# Patient Record
Sex: Male | Born: 2000 | Race: Black or African American | Hispanic: No | Marital: Single | Smoking: Never smoker
Health system: Southern US, Community
[De-identification: ages and names within clinical notes are randomized; demographics above are authoritative.]

## PROBLEM LIST (undated history)

## (undated) DIAGNOSIS — I340 Nonrheumatic mitral (valve) insufficiency: Secondary | ICD-10-CM

## (undated) DIAGNOSIS — Q681 Congenital deformity of finger(s) and hand: Secondary | ICD-10-CM

## (undated) DIAGNOSIS — M419 Scoliosis, unspecified: Secondary | ICD-10-CM

---
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
## 2018-03-03 ENCOUNTER — Ambulatory Visit (INDEPENDENT_AMBULATORY_CARE_PROVIDER_SITE_OTHER): Payer: No Typology Code available for payment source | Admitting: Physical Medicine and Rehabilitation

## 2018-03-03 ENCOUNTER — Telehealth (INDEPENDENT_AMBULATORY_CARE_PROVIDER_SITE_OTHER): Payer: Self-pay | Admitting: Physical Medicine and Rehabilitation

## 2018-03-03 ENCOUNTER — Encounter (INDEPENDENT_AMBULATORY_CARE_PROVIDER_SITE_OTHER): Payer: Self-pay | Admitting: Physical Medicine and Rehabilitation

## 2018-03-03 ENCOUNTER — Ambulatory Visit (INDEPENDENT_AMBULATORY_CARE_PROVIDER_SITE_OTHER): Payer: Self-pay

## 2018-03-03 VITALS — BP 110/65 | HR 63 | Temp 98.4°F | Ht 71.0 in | Wt 127.2 lb

## 2018-03-03 DIAGNOSIS — M545 Low back pain: Secondary | ICD-10-CM | POA: Diagnosis not present

## 2018-03-03 DIAGNOSIS — M546 Pain in thoracic spine: Secondary | ICD-10-CM | POA: Diagnosis not present

## 2018-03-03 DIAGNOSIS — M41125 Adolescent idiopathic scoliosis, thoracolumbar region: Secondary | ICD-10-CM | POA: Diagnosis not present

## 2018-03-03 DIAGNOSIS — M7918 Myalgia, other site: Secondary | ICD-10-CM

## 2018-03-03 DIAGNOSIS — G8929 Other chronic pain: Secondary | ICD-10-CM

## 2018-03-03 NOTE — Progress Notes (Signed)
 .  Numeric Pain Rating Scale and Functional Assessment Average Pain 5 Pain Right Now 2 My pain is  Pain is worse with: bending, standing and some activites Pain improves with: rest and medication   In the last MONTH (on 0-10 scale) has pain interfered with the following?  1. General activity like being  able to carry out your everyday physical activities such as walking, climbing stairs, carrying groceries, or moving a chair?  Rating(4)  2. Relation with others like being able to carry out your usual social activities and roles such as  activities at home, at work and in your community. Rating(2)  3. Enjoyment of life such that you have  been bothered by emotional problems such as feeling anxious, depressed or irritable?  Rating(2)

## 2018-03-04 MED FILL — ERYTHROMYCIN-BENZOYL GEL: 5-3 | 30 days supply | Qty: 47 | Fill #4

## 2018-03-04 NOTE — Telephone Encounter (Signed)
Sent referral for [facility] [place]

## 2018-03-04 NOTE — Telephone Encounter (Signed)
Patient notified

## 2018-03-07 MED FILL — VYVANSE 20 MG CAPSULE: 20 | 30 days supply | Qty: 30 | Fill #0

## 2018-03-09 ENCOUNTER — Encounter (INDEPENDENT_AMBULATORY_CARE_PROVIDER_SITE_OTHER): Payer: Self-pay | Admitting: Physical Medicine and Rehabilitation

## 2018-03-09 NOTE — Progress Notes (Signed)
Eric Thomas - 17 y.o. male MRN [id number]  Date of birth: 09/24/2000  Office Visit Note: Visit Date: 03/03/2018 PCP: Farris HasMorrow, Aaron, MD Referred by: Farris HasMorrow, Aaron, MD  Subjective: Chief Complaint  Patient presents with  . Spine - Pain  . Neck - Pain  . Middle Back - Pain  . Lower Back - Pain   HPI: Eric Thomas is a pleasant 17 year old young gentleman present with his mother who also provides some of the history.  He has a history of idiopathic scoliosis that has been [facility] by Dr. Fanny Bienichard C. Henderson.  He was last seen there for routine follow-up on 10/30/2014.  He was started in a brace in 11/13 for documented progression up to 27 degrees.  This brace was discontinued in 2016 Lajoyce CornersDuda lack of progression of the curve.  According to the records there was some concern at one point of possible Marfan syndrome and patient underwent some genetic testing and this was determined not to be the case.  He comes in now with increasing axial neck pain really centered around the C7 spinous process and in the musculature really bilaterally.  He also has some lower back pain that he feels like is worse at times with lifting and twisting at work.  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  He is wondering about reduced activity which he is able to do and still function at his main job.  He has not had any severe back pain but just worsening back pain.  He rates his pain as a 4 out of 10 but it can get worse at times with particular movements and on particular days.  He has not had any radicular complaints down the arms or paresthesias.  No complaints down the legs or paresthesias or weakness.  His symptoms of just been progressive particularly at work.  In his spare time he does not really exercise at all he does like using the phone in the computer.  He does try to maintain a computer height above high-level and is not spending too much time  looking forward but he can do that with the phone at times.  He has not had recent physical therapy.  He manages pain relief with over-the-counter medication.       Review of Systems  Constitutional: Negative for chills, fever, malaise/fatigue and weight loss.  HENT: Negative for hearing loss and sinus pain.   Eyes: Negative for blurred vision, double vision and photophobia.  Respiratory: Negative for cough and shortness of breath.   Cardiovascular: Negative for chest pain, palpitations and leg swelling.  Gastrointestinal: Negative for abdominal pain, nausea and vomiting.  Genitourinary: Negative for flank pain.  Musculoskeletal: Positive for back pain and neck pain. Negative for myalgias.  Skin: Negative for itching and rash.  Neurological: Negative for tremors, focal weakness and weakness.  Endo/Heme/Allergies: Negative.   Psychiatric/Behavioral: Negative for depression.  All other systems reviewed and are negative.  Otherwise per HPI.  Assessment & Plan: Visit Diagnoses:  1. Adolescent idiopathic scoliosis of thoracolumbar region   2. Chronic midline thoracic back pain   3. Chronic midline low back pain without sciatica   4. Myofascial pain syndrome     Plan: Findings:  History of idiopathic scoliosis managed at [facility] with bracing up until 2016.  Curvature was noted to have progressed around 27 degrees.  I did find images that were taken  prior to the office visit at [facility] and this was done locally in town so I was able to see an old picture.  He had a thoraco-lumbar curve which was 27 and a lumbar curve which is 24 degrees Cobb angle.  We did take x-rays today and there is really no changes overall.  In a limited way we can do what I measure Cobb angle of approximately 34 degrees.  This is within the realm of measurement error on the images.  Just from an eyeball standpoint it did not appear the curve was significantly worse.  Otherwise he has some mild degenerative change on  the left at the lower lumbar region but other than that fairly normal spine other than the curvature.  He is getting some curvature obviously of the rib cage.  Interestingly the iliac crest heights are equal and there is no relief seen pelvic rotation.  At this point I think regrouping with a physical therapist to look at myofascial pain findings particularly in the cervical spine and trapezius and levator scapula as well as getting him on a program for core strengthening and stretching given his history of the scoliosis.  I encouraged him to be more active and at least get a good program from the physical therapist that he can do.  I think building himself up to a degree as can help him in the long run.  We also did write  a work note with recommendations of limited lifting bending and twisting particular from the lumbar spine region.  Obviously if there is some concerns at work we can address that further.  We will see him back at this point as needed.    Meds & Orders: No orders of the defined types were placed in this encounter.   Orders Placed This Encounter  Procedures  . XR SCOLIOSIS EVAL COMPLETE SPINE 1 VIEW  . Ambulatory referral to Physical Therapy    Follow-up: Return if symptoms worsen or fail to improve.   Procedures: No procedures performed  No notes on file   Clinical History: EXAM: XR SCOLIOSIS AP ONLY DATE: 10/30/14 09:54:12 ACCESSION: [id number] UN DICTATED: 10/30/14 10:34:21 INTERPRETATION LOCATION: [facility]  CLINICAL INDICATION: 17 Year Old (M): M41.20 - Other idiopathic scoliosis.  Main wr   Out of brace  COMPARISON: Radiograph of 01/30/14  TECHNIQUE: AP erect views of the spine  FINDINGS: Redemonstration of S. shaped scoliosis, similar to prior examination. 12 rib bearing vertebral bodies. 5 lumbar type vertebral bodies. The cardiomediastinal silhouette is normal. No focal lung opacity. Nonobstructed bowel gas pattern.  IMPRESSION: No significant change in  the appearance of S. shaped scoliosis.   He reports that he has never smoked. He has never used smokeless tobacco. No results for input(s): HGBA1C, LABURIC in the last 8760 hours.  Objective:  VS:  HT:5\' 11"  (180.3 cm)   WT:127 lb 3.2 oz (57.7 kg)  BMI:17.75    BP:110/65  HR:63bpm  TEMP:98.4 F (36.9 C)(Oral)  RESP:100 % Physical Exam  Constitutional: He is oriented to person, place, and time. He appears well-developed and well-nourished. No distress.  HENT:  Head: Normocephalic and atraumatic.  Nose: Nose normal.  Mouth/Throat: Oropharynx is clear and moist.  Eyes: Pupils are equal, round, and reactive to light. Conjunctivae are normal.  Neck: Normal range of motion. Neck supple. No tracheal deviation present.  Cardiovascular: Regular rhythm and intact distal pulses.  Pulmonary/Chest: Effort normal and breath sounds normal.  Abdominal: Soft. He exhibits no distension. There is no  rebound and no guarding.  Musculoskeletal: He exhibits no deformity.  Patient is somewhat slow to arise from a seated position.  He has obviou scoliotic deformity of the thoracic and lumbar spine.  Hip heights are equal across with measurements across the iliac crest.  He has no pain with hip rotation he has good distal strength without clonus.  He does have some paraspinal tenderness along the curvature with real tight paraspinal musculature.  Cervical spine is forward flexed with pain about the C7 spinous process in particular the trapezius and levator scapular muscles.  There are focal trigger points present.  Neurological: He is alert and oriented to person, place, and time. He exhibits normal muscle tone. Coordination normal.  Skin: Skin is warm. No rash noted.  Psychiatric: He has a normal mood and affect. His behavior is normal.  Nursing note and vitals reviewed.   Ortho Exam Imaging: No results found.  Past Medical/Family/Surgical/Social History: Medications & Allergies reviewed per EMR, new  medications updated. Patient Active Problem List   Diagnosis Date Noted  . Chest pain, musculoskeletal 12/03/2016  . Arachnodactyly 03/07/2013  . Mitral insufficiency 03/07/2013  . Palpitations 03/07/2013  . Scoliosis (and kyphoscoliosis), idiopathic 06/15/2012   Past Medical History:  Diagnosis Date  . Arachnodactyly   . Mitral insufficiency   . Scoliosis    History reviewed. No pertinent family history. History reviewed. No pertinent surgical history. Social History   Occupational History  . Not on file  Tobacco Use  . Smoking status: Never Smoker  . Smokeless tobacco: Never Used  Substance and Sexual Activity  . Alcohol use: Never    Frequency: Never  . Drug use: Never  . Sexual activity: Not on file

## 2018-03-11 ENCOUNTER — Encounter: Payer: Self-pay | Admitting: Physical Therapy

## 2018-03-11 ENCOUNTER — Other Ambulatory Visit: Payer: Self-pay

## 2018-03-11 ENCOUNTER — Ambulatory Visit
Payer: No Typology Code available for payment source | Attending: Physical Medicine and Rehabilitation | Admitting: Physical Therapy

## 2018-03-11 DIAGNOSIS — M6281 Muscle weakness (generalized): Secondary | ICD-10-CM | POA: Diagnosis present

## 2018-03-11 DIAGNOSIS — M546 Pain in thoracic spine: Secondary | ICD-10-CM | POA: Diagnosis present

## 2018-03-11 DIAGNOSIS — R293 Abnormal posture: Secondary | ICD-10-CM | POA: Insufficient documentation

## 2018-03-11 DIAGNOSIS — G8929 Other chronic pain: Secondary | ICD-10-CM | POA: Insufficient documentation

## 2018-03-11 DIAGNOSIS — M545 Low back pain: Secondary | ICD-10-CM | POA: Insufficient documentation

## 2018-03-11 NOTE — Therapy (Signed)
[facility] [address], [address] [place], [place], [address] Phone: [phone]   Fax:  [phone]  Physical Therapy Evaluation  Patient Details  Name: Eric Thomas MRN: [id number] Date of Birth: 03/31/2001 Referring Provider: Tyrell AntonioFrederic Newton, MD    Encounter Date: 03/11/2018  PT End of Session - 03/11/18 1145    Visit Number  1    Date for PT Re-Evaluation  05/13/18    Authorization Type  [facility]    Authorization Time Period  03/11/18 to 05/13/18    PT Start Time  1059    PT Stop Time  1137    PT Time Calculation (min)  38 min    Activity Tolerance  No increased pain;Patient tolerated treatment well    Behavior During Therapy  [facility] for tasks assessed/performed       Past Medical History:  Diagnosis Date  . Arachnodactyly   . Mitral insufficiency   . Scoliosis     History reviewed. No pertinent surgical history.  There were no vitals filed for this visit.   Subjective Assessment - 03/11/18 1102    Subjective  Pt reports having scoliosis diagnosis since middle school and he has been following [facility] at [employer]. He wore a back brace between 6th-9th grade but this had been discontinued. He has noticed this past summer his back started to bothering him more while at work and during class.     Pertinent History  thoracolumbar scoliosis     Limitations  Lifting;Sitting    Patient Stated Goals  decrease pain with work and school     Currently in Pain?  No/denies   typically Lt upper back and bilateral low back         [facility] PT Assessment - 03/11/18 0001      Assessment   Medical Diagnosis  adolescent idiopathic scoliosis     Referring Provider  Tyrell AntonioFrederic Newton, MD     Next MD Visit  none for now     Prior Therapy  none       Precautions   Precautions  None      Balance Screen   Has the patient fallen in the past 6 months  No    Has the patient had a decrease in activity level because of a fear of falling?    No    Is the patient reluctant to leave their home because of a fear of falling?   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       Cognition   Overall Cognitive Status  Within Functional Limits for tasks assessed      Observation/Other Assessments   Focus on Therapeutic Outcomes (FOTO)   48% limited       Sensation   Additional Comments  denies numbness and tingling       ROM / Strength   AROM / PROM / Strength  AROM;Strength      AROM   Overall AROM Comments  AROM: limited flexion and extension limited 50%, pain free all directions       Strength   Overall Strength Comments  plank elbows/toes: unable without trunk sag and excessive scap retraction    Strength Assessment Site  Shoulder    Right/Left Shoulder  Right;Left    Right Shoulder Flexion  4/5    Right Shoulder  ABduction  4/5    Left Shoulder Flexion  4/5    Left Shoulder ABduction  4/5      Flexibility   Soft Tissue Assessment /Muscle Length  yes    Hamstrings  B 90/90 testing 50 deg    Quadriceps  WNL      Palpation   Palpation comment  tender with palpation along Lt thoracic and Rt lumbar paraspinals, Lt rhomboids, Rt QL                Objective measurements completed on examination: See above findings.      [facility] Adult PT Treatment/Exercise - 03/11/18 0001      Exercises   Exercises  Lumbar      Lumbar Exercises: Stretches   Active Hamstring Stretch  5 reps;10 seconds;Left;Right    Active Hamstring Stretch Limitations  90/90      Lumbar Exercises: Seated   Other Seated Lumbar Exercises  BUE horizontal abduction with red TB x5 reps, HEP demo     Other Seated Lumbar Exercises  scap retraction x10 reps       Lumbar Exercises: Supine   Other Supine Lumbar Exercises  low trunk rotation x10 reps, hooklying              PT Education - 03/11/18 1329    Education Details  eval  findings/POC; sitting posture; implemented HEP and reviewed     Person(s) Educated  Patient    Methods  Explanation;Verbal cues;Handout    Comprehension  Verbalized understanding;Returned demonstration       PT Short Term Goals - 03/11/18 1339      PT SHORT TERM GOAL #1   Title  Pt will demo consistency and independence with his initial HEP to improve posture and decrease pain.    Time  4    Period  Weeks    Status  New    Target Date  04/11/18      PT SHORT TERM GOAL #2   Title  Pt will be able to verbalize pain management techniques throughout the day to decrease pain following a busy school/work day.    Time  4    Period  Weeks    Status  New        PT Long Term Goals - 03/11/18 1341      PT LONG TERM GOAL #1   Title  Pt will be able to maintain plank on elbows/feet for atleast 15 sec at a time, 2/3 trials, without compensation, to reflect improvements in trunk strength and endurance.     Time  8    Period  Weeks    Status  New    Target Date  05/13/18      PT LONG TERM GOAL #2   Title  Pt will demo improved hamstring flexibility to lacking no more than 30 deg with 90/90 testing to improve his sitting alignment during class.     Time  8    Period  Weeks    Status  New      PT LONG TERM GOAL #3   Title  Pt will report atleast 60% improvement in his back pain from the start of PT to allow him to work a shift without limitation.     Time  8    Period  Weeks    Status  New      PT LONG TERM GOAL #4   Title  Pt will demo proper body mechanics with lifting  10# box from the floor x5 reps without the need for cuing from the therapist.     Time  8    Period  Weeks    Status  New             Plan - 03/11/18 1146    Clinical Impression Statement  Pt is a pleasant 17 y.o M referred to OPPT with diagnosis of adolescent idiopathic scoliosis with recent onset of upper and lower midline back pain with work tasks and during class. He demonstrates active lumbar and  thoracic active ROM that is within normal limits, however he does have palpable tenderness along the Rt lumbar paraspinals and Lt rhomboids and thoracic paraspinals. Pt demonstrates poor core strength and is unable to complete a plank without compensations. He currently has to lift boxes at work and has had difficulty with back pain during the school semester. He would benefit from skilled PT to address his limitations in strength, improve his posture and body mechanics with daily activity, to facilitate his return to pain free participation in school activity and tasks at his job.     History and Personal Factors relevant to plan of care:  thoracolumbar scoliosis     Clinical Presentation  Stable    Clinical Presentation due to:  gradually worse with summer job     Clinical Decision Making  Low    Rehab Potential  Good    PT Frequency  2x / week    PT Duration  8 weeks    PT Treatment/Interventions  ADLs/Self Care Home Management;Cryotherapy;Electrical Stimulation;Moist Heat;Therapeutic activities;Therapeutic exercise;Neuromuscular re-education;Patient/family education;Manual techniques;Taping;Dry needling;Passive range of motion    PT Next Visit Plan  trunk strength and endurance; hamstring flexibility; scap strength    PT Home Exercise Plan  [email or web address]    Consulted and Agree with Plan of Care  Patient       Patient will benefit from skilled therapeutic intervention in order to improve the following deficits and impairments:  Decreased activity tolerance, Decreased safety awareness, Decreased strength, Impaired flexibility, Postural dysfunction, Pain, Improper body mechanics, Decreased range of motion, Increased muscle spasms  Visit Diagnosis: Chronic low back pain, unspecified back pain laterality, with sciatica presence unspecified  Pain in thoracic spine  Muscle weakness (generalized)  Abnormal posture     Problem List Patient Active Problem List   Diagnosis Date Noted  . Chest  pain, musculoskeletal 12/03/2016  . Arachnodactyly 03/07/2013  . Mitral insufficiency 03/07/2013  . Palpitations 03/07/2013  . Scoliosis (and kyphoscoliosis), idiopathic 06/15/2012   2:02 PM,03/11/18 Donita Brooks PT, DPT [facility] [place]  [phone]  [facility] [address] [place], [place], [address] Phone: [phone]   Fax:  [phone]  Name: Eric Thomas MRN: [id number] Date of Birth: [birth year]

## 2018-03-11 NOTE — Patient Instructions (Signed)
Access Code: [email or web address]  URL: [email or web address]  Date: 03/11/2018  Prepared by: Marylyn IshiharaSara Kiser   Exercises  Correct Seated Posture - 10 reps - 3 sets - 1x daily - 7x weekly  Seated Scapular Retraction - 10 reps - 2 sets - 2x daily - 7x weekly  Seated Shoulder Horizontal Abduction with Resistance - 10 reps - 2 sets - 2x daily - 7x weekly  Supine Hamstring Stretch - 10 reps - 10 hold - 2x daily - 7x weekly  Supine Lower Trunk Rotation - 10 reps - 1 sets - 2x daily - 7x weekly    [facility] [address], [address] [place], [place] [address] Phone # [phone] Fax [phone]

## 2018-03-17 MED FILL — ADAPALENE 0.1% CREAM: 0.1 | 30 days supply | Qty: 45 | Fill #2

## 2018-03-23 ENCOUNTER — Encounter: Payer: Self-pay | Admitting: Physical Therapy

## 2018-03-23 ENCOUNTER — Ambulatory Visit: Payer: No Typology Code available for payment source | Admitting: Physical Therapy

## 2018-03-23 DIAGNOSIS — R293 Abnormal posture: Secondary | ICD-10-CM

## 2018-03-23 DIAGNOSIS — M545 Low back pain: Principal | ICD-10-CM

## 2018-03-23 DIAGNOSIS — M546 Pain in thoracic spine: Secondary | ICD-10-CM

## 2018-03-23 DIAGNOSIS — G8929 Other chronic pain: Secondary | ICD-10-CM

## 2018-03-23 DIAGNOSIS — M6281 Muscle weakness (generalized): Secondary | ICD-10-CM

## 2018-03-23 NOTE — Therapy (Signed)
[facility] [address], [address] [place], [place], [address] Phone: [phone]   Fax:  [phone]  Physical Therapy Treatment  Patient Details  Name: Eric Thomas MRN: [id number] Date of Birth: 10/04/2000 Referring Provider: Tyrell AntonioFrederic Newton, MD    Encounter Date: 03/23/2018  PT End of Session - 03/23/18 1224    Visit Number  2    Date for PT Re-Evaluation  05/13/18    Authorization Type  [facility]    Authorization Time Period  03/11/18 to 05/13/18    PT Start Time  1145    PT Stop Time  1224    PT Time Calculation (min)  39 min    Activity Tolerance  No increased pain;Patient tolerated treatment well    Behavior During Therapy  [facility] for tasks assessed/performed       Past Medical History:  Diagnosis Date  . Arachnodactyly   . Mitral insufficiency   . Scoliosis     History reviewed. No pertinent surgical history.  There were no vitals filed for this visit.  Subjective Assessment - 03/23/18 1146    Subjective  Pt states things are going well. No difficulty with his exercises.     Pertinent History  thoracolumbar scoliosis     Limitations  Lifting;Sitting    Patient Stated Goals  decrease pain with work and school     Currently in Pain?  Yes    Pain Score  2     Pain Location  Shoulder    Pain Orientation  Right;Medial;Upper    Pain Descriptors / Indicators  Aching    Pain Type  Acute pain    Pain Radiating Towards  none     Pain Onset  More than a month ago    Pain Frequency  Intermittent    Aggravating Factors   alot of activity     Pain Relieving Factors  unsure                        [facility] Adult PT Treatment/Exercise - 03/23/18 0001      Exercises   Exercises  Shoulder;Other Exercises;Lumbar    Other Exercises   B knees to chest stretch x10 reps 5 sec hold       Lumbar Exercises: Standing   Other Standing Lumbar Exercises  plank hold on counter, elbows extended 3x20 sec        Lumbar Exercises: Supine   Other Supine Lumbar Exercises  bent knee 90/90 with alternating heel tap x10 reps      Shoulder Exercises: Seated   Row  Both;10 reps    Row Limitations  x1 set with green, x1 set with red     Protraction  10 reps;Both    Theraband Level (Shoulder Protraction)  Level 3 (Green)    Protraction Limitations  x2 sets     Diagonals  Both;15 reps    Theraband Level (Shoulder Diagonals)  Level 2 (Red)    Diagonals Limitations  D2 flexion    Other Seated Exercises  horizontal abduction with blue TB x10 reps       Shoulder Exercises: ROM/Strengthening   UBE (Upper Arm Bike)  L1 x2 min forward, x2 min backward sitting on green physioball to encourage trunk and shoulder endurance.     Pushups  15 reps    Pushups Limitations  x2 sets on wall, 2nd set 10 reps      Plank Limitations  plank with elbows  extended x15 reps with scap retraction       standing tandem pallof press with red TB x10 reps each direction with each LE forward.       PT Education - 03/23/18 1224    Education Details  importance of continuing with therapy; updated HEP    Person(s) Educated  Patient    Methods  Handout;Verbal cues;Explanation    Comprehension  Verbalized understanding;Returned demonstration       PT Short Term Goals - 03/11/18 1339      PT SHORT TERM GOAL #1   Title  Pt will demo consistency and independence with his initial HEP to improve posture and decrease pain.    Time  4    Period  Weeks    Status  New    Target Date  04/11/18      PT SHORT TERM GOAL #2   Title  Pt will be able to verbalize pain management techniques throughout the day to decrease pain following a busy school/work day.    Time  4    Period  Weeks    Status  New        PT Long Term Goals - 03/11/18 1341      PT LONG TERM GOAL #1   Title  Pt will be able to maintain plank on elbows/feet for atleast 15 sec at a time, 2/3 trials, without compensation, to reflect improvements in trunk strength  and endurance.     Time  8    Period  Weeks    Status  New    Target Date  05/13/18      PT LONG TERM GOAL #2   Title  Pt will demo improved hamstring flexibility to lacking no more than 30 deg with 90/90 testing to improve his sitting alignment during class.     Time  8    Period  Weeks    Status  New      PT LONG TERM GOAL #3   Title  Pt will report atleast 60% improvement in his back pain from the start of PT to allow him to work a shift without limitation.     Time  8    Period  Weeks    Status  New      PT LONG TERM GOAL #4   Title  Pt will demo proper body mechanics with lifting 10# box from the floor x5 reps without the need for cuing from the therapist.     Time  8    Period  Weeks    Status  New            Plan - 03/23/18 1152    Clinical Impression Statement  Pt arrives reporting some improvements in strength with his HEP. Therapist was able to make updates with scapula and trunk strengthening and pt required minimal cuing for proper technique. Pt had increased difficulty with trunk stability exercises in supine, as well as with push up against the wall. He required encouragement from the therapist to complete this. At this time, pt will be starting college soon and is unsure what his schedule will allow regarding future treatments. Therapist encouraged to continue with PT or transfer to a [facility] location in the [place] area. He verbalized understanding of this.     Rehab Potential  Good    PT Frequency  2x / week    PT Duration  8 weeks    PT Treatment/Interventions  ADLs/Self Care Home Management;Cryotherapy;Lobbyistlectrical  Stimulation;Moist Heat;Therapeutic activities;Therapeutic exercise;Neuromuscular re-education;Patient/family education;Manual techniques;Taping;Dry needling;Passive range of motion    PT Next Visit Plan  trunk strength and endurance; hamstring flexibility; scap strength    PT Home Exercise Plan  [email or web address]    Consulted and Agree with Plan of Care  Patient        Patient will benefit from skilled therapeutic intervention in order to improve the following deficits and impairments:  Decreased activity tolerance, Decreased safety awareness, Decreased strength, Impaired flexibility, Postural dysfunction, Pain, Improper body mechanics, Decreased range of motion, Increased muscle spasms  Visit Diagnosis: Chronic low back pain, unspecified back pain laterality, with sciatica presence unspecified  Pain in thoracic spine  Muscle weakness (generalized)  Abnormal posture     Problem List Patient Active Problem List   Diagnosis Date Noted  . Chest pain, musculoskeletal 12/03/2016  . Arachnodactyly 03/07/2013  . Mitral insufficiency 03/07/2013  . Palpitations 03/07/2013  . Scoliosis (and kyphoscoliosis), idiopathic 06/15/2012   12:27 PM,03/23/18 Donita Brooks PT, DPT [facility] [place]  [phone]  [facility] [address] [place], [place], [address] Phone: [phone]   Fax:  [phone]  Name: Eric Thomas MRN: [id number] Date of Birth: 08-25-2000

## 2018-03-23 NOTE — Patient Instructions (Signed)
Access Code: [email or web address]  URL: [email or web address]  Date: 03/23/2018  Prepared by: Marylyn IshiharaSara Kiser   Exercises  Correct Seated Posture - 10 reps - 3 sets - 1x daily - 7x weekly  Seated Shoulder Horizontal Abduction with Resistance - 10 reps - 2 sets - 2x daily - 7x weekly  Supine Hamstring Stretch - 10 reps - 10 hold - 2x daily - 7x weekly  Standing Serratus Punch with Resistance - 10 reps - 2 sets - 2x daily - 7x weekly  Supine Lower Trunk Rotation - 10 reps - 1 sets - 2x daily - 7x weekly  Standing Row with Anchored Resistance - 10 reps - 2 sets - 2x daily - 7x weekly  Standing Shoulder Horizontal and Diagonal Pulls with Resistance - 15 reps - 1 sets - 2x daily - 7x weekly  Plank on Counter - 2 reps - 30 hold - 2x daily - 7x weekly  Push Up on Table - 10 reps - 3 sets - 2x daily - 7x weekly   [facility] [address], [address] [place], [place] [address] Phone # [phone] Fax [phone]

## 2018-03-24 MED FILL — ADDERALL XR 20 MG CAP SA: 20 | 30 days supply | Qty: 30 | Fill #0

## 2018-03-28 ENCOUNTER — Encounter: Payer: No Typology Code available for payment source | Admitting: Physical Therapy

## 2018-03-29 ENCOUNTER — Ambulatory Visit: Payer: No Typology Code available for payment source | Attending: Physical Medicine and Rehabilitation

## 2018-03-29 DIAGNOSIS — G8929 Other chronic pain: Secondary | ICD-10-CM | POA: Insufficient documentation

## 2018-03-29 DIAGNOSIS — M545 Low back pain: Secondary | ICD-10-CM | POA: Insufficient documentation

## 2018-03-29 DIAGNOSIS — M546 Pain in thoracic spine: Secondary | ICD-10-CM | POA: Insufficient documentation

## 2018-03-29 DIAGNOSIS — R293 Abnormal posture: Secondary | ICD-10-CM | POA: Insufficient documentation

## 2018-03-29 DIAGNOSIS — M6281 Muscle weakness (generalized): Secondary | ICD-10-CM | POA: Insufficient documentation

## 2018-03-30 ENCOUNTER — Encounter: Payer: No Typology Code available for payment source | Admitting: Physical Therapy

## 2018-03-31 ENCOUNTER — Ambulatory Visit: Payer: No Typology Code available for payment source

## 2018-03-31 DIAGNOSIS — G8929 Other chronic pain: Secondary | ICD-10-CM | POA: Diagnosis present

## 2018-03-31 DIAGNOSIS — M546 Pain in thoracic spine: Secondary | ICD-10-CM | POA: Diagnosis present

## 2018-03-31 DIAGNOSIS — M6281 Muscle weakness (generalized): Secondary | ICD-10-CM | POA: Diagnosis present

## 2018-03-31 DIAGNOSIS — M545 Low back pain: Secondary | ICD-10-CM | POA: Diagnosis present

## 2018-03-31 DIAGNOSIS — R293 Abnormal posture: Secondary | ICD-10-CM | POA: Diagnosis present

## 2018-03-31 NOTE — Therapy (Signed)
[facility] [address], [place], [address] Phone: [phone]   Fax:  [phone]  Physical Therapy Treatment  Patient Details  Name: Eric Thomas MRN: [id number] Date of Birth: [birth year] Referring Provider: Tyrell Antonio, MD    Encounter Date: 03/31/2018  PT End of Session - 03/31/18 1754    Visit Number  3    Date for PT Re-Evaluation  05/13/18    Authorization Time Period  03/11/18 to 05/13/18    PT Start Time  1700    PT Stop Time  1740    PT Time Calculation (min)  40 min    Activity Tolerance  No increased pain;Patient tolerated treatment well    Behavior During Therapy  [facility] for tasks assessed/performed       Past Medical History:  Diagnosis Date  . Arachnodactyly   . Mitral insufficiency   . Scoliosis     History reviewed. No pertinent surgical history.  There were no vitals filed for this visit.  Subjective Assessment - 03/31/18 1656    Subjective  Patient reports that once his pain starts, he has it all day. States it happens pretty often. States he is keeping up pretty well with his exercises.    Pertinent History  thoracolumbar scoliosis     Patient Stated Goals  decrease pain with work and school     Currently in Pain?  Yes    Pain Score  2     Pain Location  Shoulder    Pain Orientation  Right;Medial;Upper    Pain Descriptors / Indicators  Tightness    Pain Onset  More than a month ago         Therapeutic exercise: Patient performed with instruction, verbal cues, tactile cues of therapist: goal: improve strength and posture  Lumbar extensions 2x10 with PT assist HS popliteal stretchs 3x30" with PT assist Serratus punches in supine and 5#lb dumbbell 2x10 b/l tactile cues Wall pushup plus 2x10 tactile cues Doorway rhomboid stretch 2 reps x30sec Standard plank x15sec x2 verbal/tactile cues Side plank on knees b/l x15sec x2 verbal cues Bird dog position, 1 leg extension x5  b/l, verbal/tactile cues for posture/core activation Single leg leg lift x10 b/l cues for pacing, technique   Patient response to treatment: patient demonstrated improved technique with exercises with repeated demonstration and VC. Patient reported intermittent pain with some exercises, modified accordingly to avoid symptoms. No complaints at end of session.        PT Education - 03/31/18 1744    Education Details  updated HEP, importance of compliance    Person(s) Educated  Patient    Methods  Demonstration;Verbal cues;Explanation;Handout    Comprehension  Verbalized understanding;Returned demonstration       PT Short Term Goals - 03/11/18 1339      PT SHORT TERM GOAL #1   Title  Pt will demo consistency and independence with his initial HEP to improve posture and decrease pain.    Time  4    Period  Weeks    Status  New    Target Date  04/11/18      PT SHORT TERM GOAL #2   Title  Pt will be able to verbalize pain management techniques throughout the day to decrease pain following a busy school/work day.    Time  4    Period  Weeks    Status  New        PT Long Term  Goals - 03/11/18 1341      PT LONG TERM GOAL #1   Title  Pt will be able to maintain plank on elbows/feet for atleast 15 sec at a time, 2/3 trials, without compensation, to reflect improvements in trunk strength and endurance.     Time  8    Period  Weeks    Status  New    Target Date  05/13/18      PT LONG TERM GOAL #2   Title  Pt will demo improved hamstring flexibility to lacking no more than 30 deg with 90/90 testing to improve his sitting alignment during class.     Time  8    Period  Weeks    Status  New      PT LONG TERM GOAL #3   Title  Pt will report atleast 60% improvement in his back pain from the start of PT to allow him to work a shift without limitation.     Time  8    Period  Weeks    Status  New      PT LONG TERM GOAL #4   Title  Pt will demo proper body mechanics with lifting  10# box from the floor x5 reps without the need for cuing from the therapist.     Time  8    Period  Weeks    Status  New            Plan - 03/31/18 1749    Clinical Impression Statement  Patient demonstrated good carryover with performance of exercises with cues from PT. Exercises modified for any pain symptoms to avoid pain.  Instructed in importance of compliance with HEP and to call/ask for assistance as needed. Patient and family in agreement with 1 month follow up to ensure HEP compliance and progress, if needed POC will be reassessed at that time.      Rehab Potential  Good    PT Frequency  Monthy    PT Duration  4 weeks    PT Treatment/Interventions  ADLs/Self Care Home Management;Cryotherapy;Electrical Stimulation;Moist Heat;Therapeutic activities;Therapeutic exercise;Neuromuscular re-education;Patient/family education;Manual techniques;Taping;Dry needling;Passive range of motion    PT Next Visit Plan  trunk strength and endurance; hamstring flexibility; scap strength    PT Home Exercise Plan  [email or web address]    Consulted and Agree with Plan of Care  Patient       Patient will benefit from skilled therapeutic intervention in order to improve the following deficits and impairments:  Decreased activity tolerance, Decreased safety awareness, Decreased strength, Impaired flexibility, Postural dysfunction, Pain, Improper body mechanics, Decreased range of motion, Increased muscle spasms  Visit Diagnosis: Chronic low back pain, unspecified back pain laterality, with sciatica presence unspecified  Pain in thoracic spine  Abnormal posture  Muscle weakness (generalized)     Problem List Patient Active Problem List   Diagnosis Date Noted  . Chest pain, musculoskeletal 12/03/2016  . Arachnodactyly 03/07/2013  . Mitral insufficiency 03/07/2013  . Palpitations 03/07/2013  . Scoliosis (and kyphoscoliosis), idiopathic 06/15/2012    Olga Coasteriana Gery Sabedra PT, DPT 5:56  PM,03/31/18 [phone]  [facility] [address] [place], [place], [address] Phone: [phone]   Fax:  [phone]  Name: Eric Thomas MRN: [id number] Date of Birth: 01/11/2001

## 2018-03-31 NOTE — Patient Instructions (Signed)
Access Code: [email or web address]  URL: [email or web address]  Date: 03/31/2018  Prepared by: Olga Coasteriana Terree Gaultney   Exercises  Supine Hamstring Stretch - 10 reps - 10 hold - 2x daily - 7x weekly  Doorway Rhomboid Stretch - 3 reps - 1 sets - 30 hold - 2x daily - 7x weekly  Correct Seated Posture - 10 reps - 3 sets - 1x daily - 7x weekly  Supine Lower Trunk Rotation - 10 reps - 1 sets - 2x daily - 7x weekly  Seated Shoulder Horizontal Abduction with Resistance - 10 reps - 2 sets - 2x daily - 4x weekly  Standing Serratus Punch with Resistance - 10 reps - 2 sets - 2x daily - 4x weekly  Standing Row with Anchored Resistance - 10 reps - 2 sets - 2x daily - 4x weekly  Standing Shoulder Horizontal and Diagonal Pulls with Resistance - 15 reps - 1 sets - 2x daily - 4x weekly  Push Up on Table - 10 reps - 2 sets - 2x daily - 4x weekly  Standard Plank - 3 reps - 1 sets - 15 hold - 1x daily - 4x weekly  Side Plank on Knees - 3 reps - 1 sets - 15 hold - 1x daily - 4x weekly  Bird Dog - 3 reps - 1 sets - 1x daily - 4x weekly  Supine Single Leg Lift - 3 reps - 1 sets - 1x daily - 4x weekly  Prone Chest Lift with Arms Overhead - 10 reps - 1 sets - 1x daily - 4x weekly

## 2018-04-25 ENCOUNTER — Ambulatory Visit: Payer: No Typology Code available for payment source

## 2018-05-04 ENCOUNTER — Ambulatory Visit: Payer: No Typology Code available for payment source | Attending: Physical Medicine and Rehabilitation

## 2018-05-04 DIAGNOSIS — M6281 Muscle weakness (generalized): Secondary | ICD-10-CM | POA: Insufficient documentation

## 2018-05-04 DIAGNOSIS — M546 Pain in thoracic spine: Secondary | ICD-10-CM | POA: Insufficient documentation

## 2018-05-04 DIAGNOSIS — R293 Abnormal posture: Secondary | ICD-10-CM | POA: Diagnosis present

## 2018-05-04 NOTE — Therapy (Addendum)
[facility] [address] [place], [address] Phone: [phone]   Fax:  [phone]  Physical Therapy Treatment/Recertification/Progress Note  Dates of reporting period   03/31/18   to   05/04/18  Patient Details  Name: Eric Thomas MRN: [id number] Date of Birth: [birth year] Referring Provider (PT): Magnus Sinning, MD    Encounter Date: 05/04/2018  PT End of Session - 05/04/18 1533    Visit Number  4    Date for PT Re-Evaluation  06/29/18    Authorization Time Period  05/04/18 to 06/29/18    PT Start Time  0488    PT Stop Time  1615    PT Time Calculation (min)  40 min    Activity Tolerance  Patient tolerated treatment well;No increased pain    Behavior During Therapy  WFL for tasks assessed/performed       Past Medical History:  Diagnosis Date  . Arachnodactyly   . Mitral insufficiency   . Scoliosis     History reviewed. No pertinent surgical history.  There were no vitals filed for this visit.  Subjective Assessment - 05/04/18 1533    Subjective  Pt denies pain upon arrival today. He reports upper L back pain after sitting for extended periods while in class. No specific questions or concerns upon arrival. He states that he is about 20% improved since starting therapy.     Pertinent History  thoracolumbar scoliosis     Patient Stated Goals  decrease pain with work and school     Currently in Pain?  No/denies    Pain Onset  --        TREATMENT  Ther-ex  NuStep L3 x 4 minutes during history; Front plank on elbows 30s, 20s, 25s, compensatory strategy noted with trials 2 and 3; Examined overhead flexion and scaption with 2# weight with notable scapular winging and dyskinesia bilaterally; CPA performed on thoracic and lumbar spine for assessment. Palpation along periscpular muscles and lumbar extensors; Omega chest press with serratus press at end 25# 2 x 12, pt reaches muscle fatigue at rep  12; Omega rows 15# x 20, 25# x 15; HEP review with patient, discussed getting familiar with student recreation gym at [employer]. Pt needs a comprehensive full body strengthening program.   Trigger Point Dry Needling (TDN), unbilled Education performed with patient regarding potential benefit of TDN. Reviewed precautions and risks with patient. Reviewed special precautions/risks over lung fields which include pneumothorax. Reviewed signs and symptoms of pneumothorax and advised pt to go to ER immediately if these symptoms develop advise them of dry needling treatment. Extensive time spent with pt to ensure full understanding of TDN risks. Pt provided verbal consent to treatment. TDN performed with 0.30 x 60 single needle placements to L rhomboid major/minor and L iliocostalis utilizing shelving technique with local twitch response (LTR). Pistoning technique utilized. Improved pain-free motion of L shoulder following intervention.    Pt educated throughout session about proper posture and technique with exercises. Improved exercise technique, movement at target joints, use of target muscles after min to mod verbal, visual, tactile cues.      Pt encouraged to continue his current HEP. Discussed need for comprehensive gym-based program and encouraged patient to familiarize himself with the student recreation gym at [facility]. Discussed increasing frequency of therapy and pt states that he thinks he could manage one visit every 2 weeks. Performed TDN with patient today with notable twitch response in both rhomboids and  iliocostalis on left. Goals updated with patient. He reports approximately 20% improvement in symptoms since starting therapy. His front plank endurance has improved but is still limited and pt has started to learn pain management techniques at home. Pt will benefit from continued PT services to address remaining deficits in strength and pain.                     PT Short Term Goals  - 05/04/18 1542      PT SHORT TERM GOAL #1   Title  Pt will demo consistency and independence with his initial HEP to improve posture and decrease pain.    Time  4    Period  Weeks    Status  Achieved      PT SHORT TERM GOAL #2   Title  Pt will be able to verbalize pain management techniques throughout the day to decrease pain following a busy school/work day.    Baseline  05/04/18: pt verbalizes home strategies     Time  4    Period  Weeks    Status  Achieved        PT Long Term Goals - 05/04/18 1542      PT LONG TERM GOAL #1   Title  Pt will be able to maintain plank on elbows/feet for atleast 15 sec at a time, 2/3 trials, without compensation, to reflect improvements in trunk strength and endurance.     Baseline  05/04/18: Front plank on elbows 30s, 20s, 25s, compensatory strategy noted with trials 2 and 3;    Time  8    Period  Weeks    Status  Partially Met    Target Date  06/29/18      PT LONG TERM GOAL #2   Title  Pt will demo improved hamstring flexibility to lacking no more than 30 deg with 90/90 testing to improve his sitting alignment during class.     Time  8    Period  Weeks    Status  Deferred      PT LONG TERM GOAL #3   Title  Pt will report atleast 60% improvement in his back pain from the start of PT to allow him to work a shift without limitation.     Baseline  05/04/18: 20% improvement    Time  8    Period  Weeks    Status  Partially Met    Target Date  06/29/18      PT LONG TERM GOAL #4   Title  Pt will demo proper body mechanics with lifting 10# box from the floor x5 reps without the need for cuing from the therapist.     Time  8    Period  Weeks    Status  Deferred            Plan - 05/04/18 1534    Clinical Impression Statement  Pt encouraged to continue his current HEP. Discussed need for comprehensive gym-based program and encouraged patient to familiarize himself with the student recreation gym at [place]. Discussed increasing frequency of  therapy and pt states that he thinks he could manage one visit every 2 weeks. Performed TDN with patient today with notable twitch response in both rhomboids and iliocostalis on left. Goals updated with patient. He reports approximately 20% improvement in symptoms since starting therapy. His front plank endurance has improved but is still limited and pt has started to learn pain management techniques at home.  Pt will benefit from continued PT services to address remaining deficits in strength and pain.     Clinical Presentation  Stable    Clinical Decision Making  Low    Rehab Potential  Good    PT Frequency  2x / week    PT Duration  8 weeks    PT Treatment/Interventions  ADLs/Self Care Home Management;Cryotherapy;Electrical Stimulation;Moist Heat;Therapeutic activities;Therapeutic exercise;Neuromuscular re-education;Patient/family education;Manual techniques;Taping;Dry needling;Passive range of motion;Traction;Ultrasound;Iontophoresis '4mg'$ /ml Dexamethasone    PT Next Visit Plan  trunk strength and endurance; hamstring flexibility; scap strength    PT Home Exercise Plan  [email or web address]    Consulted and Agree with Plan of Care  Patient       Patient will benefit from skilled therapeutic intervention in order to improve the following deficits and impairments:  Decreased activity tolerance, Decreased safety awareness, Decreased strength, Impaired flexibility, Postural dysfunction, Pain, Improper body mechanics, Decreased range of motion, Increased muscle spasms  Visit Diagnosis: Pain in thoracic spine  Abnormal posture  Muscle weakness (generalized)     Problem List Patient Active Problem List   Diagnosis Date Noted  . Chest pain, musculoskeletal 12/03/2016  . Arachnodactyly 03/07/2013  . Mitral insufficiency 03/07/2013  . Palpitations 03/07/2013  . Scoliosis (and kyphoscoliosis), idiopathic 06/15/2012   Lyndel Safe Huprich PT, DPT, GCS  Huprich,Jason 05/05/2018, 1:14 PM  [facility] [address], [place], [address] Phone: [phone]   Fax:  [phone]  Name: Tyrus Wilms MRN: [id number] Date of Birth: [birth year]

## 2018-05-05 NOTE — Addendum Note (Signed)
Addended by: Ria Comment D on: 05/05/2018 01:15 PM   Modules accepted: Orders

## 2018-05-10 MED FILL — ADDERALL XR 20 MG CAP SA: 20 | 30 days supply | Qty: 30 | Fill #0

## 2018-05-10 MED FILL — ERYTHROMYCIN-BENZOYL GEL: 5-3 | 30 days supply | Qty: 47 | Fill #0

## 2018-05-17 MED FILL — VYVANSE 30 MG CAPSULE: 30 | 30 days supply | Qty: 30 | Fill #0

## 2018-05-19 ENCOUNTER — Ambulatory Visit (INDEPENDENT_AMBULATORY_CARE_PROVIDER_SITE_OTHER): Payer: Self-pay | Admitting: Family Medicine

## 2018-05-19 DIAGNOSIS — Z23 Encounter for immunization: Secondary | ICD-10-CM

## 2018-05-19 NOTE — Progress Notes (Signed)
Immunization status: given left deltoid. Pt. Tolerated well. VIS information was given

## 2018-05-23 ENCOUNTER — Encounter: Payer: Self-pay | Admitting: Physical Therapy

## 2018-05-23 ENCOUNTER — Ambulatory Visit: Payer: No Typology Code available for payment source | Admitting: Physical Therapy

## 2018-05-23 DIAGNOSIS — M6281 Muscle weakness (generalized): Secondary | ICD-10-CM

## 2018-05-23 DIAGNOSIS — M546 Pain in thoracic spine: Secondary | ICD-10-CM

## 2018-05-23 DIAGNOSIS — R293 Abnormal posture: Secondary | ICD-10-CM

## 2018-05-23 NOTE — Therapy (Signed)
[facility] [address] [place], [address] Phone: [phone]   Fax:  [phone]  Physical Therapy Treatment  Patient Details  Name: Eric Thomas MRN: [id number] Date of Birth: 28-Sep-2000 Referring Provider (PT): Magnus Sinning, MD    Encounter Date: 05/23/2018  PT End of Session - 05/23/18 1546    Visit Number  5    Date for PT Re-Evaluation  06/29/18    Authorization Time Period  05/04/18 to 06/29/18    PT Start Time  4650    PT Stop Time  3546    PT Time Calculation (min)  50 min    Activity Tolerance  Patient tolerated treatment well;No increased pain;Patient limited by fatigue;Patient limited by pain    Behavior During Therapy  [facility] for tasks assessed/performed       Past Medical History:  Diagnosis Date  . Arachnodactyly   . Mitral insufficiency   . Scoliosis     History reviewed. No pertinent surgical history.  There were no vitals filed for this visit.  Subjective Assessment - 05/23/18 1545    Subjective  Patient reports decrease in pain overall since his last visit. He states he has been less consistent with his exercises over the last few weeks but his HEP has been going well when he does the exercises. No questions about them. Unable to estimate a percentage of improvement since starting PT.     Pertinent History  thoracolumbar scoliosis     Limitations  Lifting;Sitting    Patient Stated Goals  decrease pain with work and school     Currently in Pain?  No/denies    Pain Onset  More than a month ago           TREATMENT  Therapeutic exercise: to centralize symptoms and improve ROM and strength required for successful completion of functional activities.   - Treadmill level 2-82mh at 0% grade x 538m during history taking. For improved lower extremity mobility, muscular endurance, and weightbearing activity tolerance; and to induce the analgesic effect of aerobic exercise,  stimulate improved joint nutrition, and prepare body structures and systems for following interventions.  - Front plank on elbows 30s x 3, compensatory strategy noted  - Side plank 30 seconds x 3 each side, compensatory strategy noted  - Omega chest press with serratus press at end 15# 2 x 15, - Omega rows 15#, 2 x 15, 25# too heavy - Prone hip extensions with knee flexed to bias glute max and activate lower posterior back musculature. 2x10 each side against gravity.  - supine hip thrust/bridge with back on small theraball with SBA guarding for safety to prevent rolling off table. To activate core and posterior chain strengthening. 2x10.   - Pallof press x10 each side against double RTB to strengthen rotational muscle of trunk and hips.  - Side stepping with red theraband wrapped around legs x2 and held with bilateral shoulders in ER  activate core and hip stabilizers and abductors in functional weightbearing position. Cuing to activate abdominals. Stepping a few steps out and in as allowed by band x 10 feet each side.  - seated autoelongation on theraball with mirror biofeedback and bracing shoulder girdle at 90 degrees shoulder abduction, shoulder ER, and elbow flexion pressing stick into floor during exhale and relaxing during inhale. X 4 min with constant cuing for improved posture.   HEP review with patient, discussed again with getting familiar with student recreation gym at [facility]  HOME EXERCISE PROGRAM Access Code: [email or web address]  URL: [email or web address]  Date: 05/23/2018  Prepared by: Rosita Kea   Exercises  Supine Hamstring Stretch - 10 reps - 10 hold - 2x daily - 7x weekly  Doorway Rhomboid Stretch - 3 reps - 1 sets - 30 hold - 2x daily - 7x weekly  Correct Seated Posture - 10 reps - 3 sets - 1x daily - 7x weekly  Supine Lower Trunk Rotation - 10 reps - 1 sets - 2x daily - 7x weekly  Seated Shoulder Horizontal Abduction with Resistance - 10 reps - 2 sets - 2x daily  - 4x weekly  Standing Serratus Punch with Resistance - 10 reps - 2 sets - 2x daily - 4x weekly  Standing Row with Anchored Resistance - 10 reps - 2 sets - 2x daily - 4x weekly  Standing Shoulder Horizontal and Diagonal Pulls with Resistance - 15 reps - 1 sets - 2x daily - 4x weekly  Push Up on Table - 10 reps - 2 sets - 2x daily - 4x weekly  Standard Plank - 3 reps - 1 sets - 30 hold - 1x daily - 4x weekly  Side Plank on Elbow - 3 reps - 30 second hold - 1x daily - 4x weekly  Bird Dog - 8 reps - 2 sets - 1 second hold - 1x daily - 4x weekly  Supine Single Leg Lift - 3 reps - 1 sets - 1x daily - 4x weekly  Prone Chest Lift with Arms Overhead - 10 reps - 1 sets - 1x daily - 4x weekly    Pt educated throughout session about proper posture and technique with exercises. Improved exercise technique, movement at target joints, use of target muscles after min to mod verbal, visual, tactile cues. Agree that patients needs a comprehensive full body strengthening program.     Patient response to treatment:  Pt tolerated treatment well. Pt was able to complete all exercises with minimal to no lasting increase in pain or discomfort. He complained of some increased tightness and discomfort at the base of the cervical spine after rows and chest presses that dissipated with rest of that area. Patient fatigued very quickly and had difficulty controlling posture. He was unable to tolerate as much weight for rows and presses compared to last visit and it appears that he is only minimally completing HEP or partially completing HEP. Reviewed importance of continuing HEP faithfully at home. Pt agreed he can come about 1 time a month and wanted to continue therapy. Pt required cuing for proper technique and to facilitate improved neuromuscular control, strength, range of motion, and functional ability. Willing to participate in all that was requested of him. Pt appears to be making minimal progress towards goals at this  point.    Patient will benefit from skilled PT intervention to address current body structure impairments and activity limitations to improve function and work towards goals set in current POC.      PT Education - 05/23/18 1546    Education Details  Exercise technique/form    Person(s) Educated  Patient    Methods  Demonstration    Comprehension  Verbalized understanding;Returned demonstration       PT Short Term Goals - 05/04/18 1542      PT SHORT TERM GOAL #1   Title  Pt will demo consistency and independence with his initial HEP to improve posture and decrease pain.    Time  4    Period  Weeks    Status  Achieved      PT SHORT TERM GOAL #2   Title  Pt will be able to verbalize pain management techniques throughout the day to decrease pain following a busy school/work day.    Baseline  05/04/18: pt verbalizes home strategies     Time  4    Period  Weeks    Status  Achieved        PT Long Term Goals - 05/04/18 1542      PT LONG TERM GOAL #1   Title  Pt will be able to maintain plank on elbows/feet for atleast 15 sec at a time, 2/3 trials, without compensation, to reflect improvements in trunk strength and endurance.     Baseline  05/04/18: Front plank on elbows 30s, 20s, 25s, compensatory strategy noted with trials 2 and 3;    Time  8    Period  Weeks    Status  Partially Met    Target Date  06/29/18      PT LONG TERM GOAL #2   Title  Pt will demo improved hamstring flexibility to lacking no more than 30 deg with 90/90 testing to improve his sitting alignment during class.     Time  8    Period  Weeks    Status  Deferred      PT LONG TERM GOAL #3   Title  Pt will report atleast 60% improvement in his back pain from the start of PT to allow him to work a shift without limitation.     Baseline  05/04/18: 20% improvement    Time  8    Period  Weeks    Status  Partially Met    Target Date  06/29/18      PT LONG TERM GOAL #4   Title  Pt will demo proper body  mechanics with lifting 10# box from the floor x5 reps without the need for cuing from the therapist.     Time  8    Period  Weeks    Status  Deferred            Plan - 05/23/18 1546    Clinical Impression Statement  Pt tolerated treatment well. Pt was able to complete all exercises with minimal to no lasting increase in pain or discomfort. He complained of some increased tightness and discomfort at the base of the cervical spine after rows and chest presses that dissipated with rest of that area. Patient fatigued very quickly and had difficulty controlling posture. He was unable to tolerate as much weight for rows and presses compared to last visit and it appears that he is only minimally completing HEP or partially completing HEP. Reviewed importance of continuing HEP faithfully at home. Pt agreed he can come about 1 time a month and wanted to continue therapy. Pt required cuing for proper technique and to facilitate improved neuromuscular control, strength, range of motion, and functional ability. Willing to participate in all that was requested of him. Pt appears to be making minimal progress towards goals at this point.     Rehab Potential  Good    PT Frequency  2x / week    PT Duration  8 weeks    PT Treatment/Interventions  ADLs/Self Care Home Management;Cryotherapy;Electrical Stimulation;Moist Heat;Therapeutic activities;Therapeutic exercise;Neuromuscular re-education;Patient/family education;Manual techniques;Taping;Dry needling;Passive range of motion;Traction;Ultrasound;Iontophoresis '4mg'$ /ml Dexamethasone    PT Next Visit Plan  trunk strength and endurance; hamstring flexibility; scap strength  PT Home Exercise Plan  [email or web address]    Consulted and Agree with Plan of Care  Patient       Patient will benefit from skilled therapeutic intervention in order to improve the following deficits and impairments:  Decreased activity tolerance, Decreased safety awareness, Decreased strength,  Impaired flexibility, Postural dysfunction, Pain, Improper body mechanics, Decreased range of motion, Increased muscle spasms  Visit Diagnosis: Pain in thoracic spine  Abnormal posture  Muscle weakness (generalized)     Problem List Patient Active Problem List   Diagnosis Date Noted  . Chest pain, musculoskeletal 12/03/2016  . Arachnodactyly 03/07/2013  . Mitral insufficiency 03/07/2013  . Palpitations 03/07/2013  . Scoliosis (and kyphoscoliosis), idiopathic 06/15/2012   Everlean Alstrom. Graylon Good, PT, DPT 05/23/18, 5:11 PM  [facility] [address], [place], [address] Phone: [phone]   Fax:  [phone]  Name: Coalton Arch MRN: [id number] Date of Birth: 27-Oct-2000

## 2018-06-02 ENCOUNTER — Telehealth: Payer: Self-pay | Admitting: Physical Therapy

## 2018-06-02 NOTE — Telephone Encounter (Signed)
Called pt's cell number provided by mother to follow up with mother voicing concerns earlier about patient experiencing a migraine last week. Patient did not answer so left voicemail with a call back number, briefly explaining mother called with information and questions about how the headache could relate to PT and that I was unable to discuss with her without pt's permission. Invited him to call back on main clinic number [phone]) to discuss.

## 2018-06-02 NOTE — Telephone Encounter (Signed)
Called pt's mother, Alfreda, back after front desk informed me she had called and would like to discuss pt's response to last treatment with me. Informed her that I was unable to discuss his care with her due to him no longer being a minor but would be happy to talk to her with his permission. She gave me his cell number and I agreed to call him after 4 pm when he gets out of class. She understood I could not offer any advice or discuss his care but she wanted to communicate with me that he has a long history of scoliosis and in the past he would experience major migraine headaches after exerting himself too much. He has not had one of these in years but he did last week and she thought it might be related to what he did in physical therapy. She also expressed uncertainty about if he needed to continue PT or not. She said she had suggested pt call me himself to discuss the migraine but she called when she found out he had not.   Pt's cell number: [phone]

## 2018-06-08 ENCOUNTER — Ambulatory Visit
Payer: No Typology Code available for payment source | Attending: Physical Medicine and Rehabilitation | Admitting: Physical Therapy

## 2018-06-09 ENCOUNTER — Encounter: Payer: Self-pay | Admitting: Physical Therapy

## 2018-06-09 ENCOUNTER — Telehealth: Payer: Self-pay | Admitting: Physical Therapy

## 2018-06-09 DIAGNOSIS — M6281 Muscle weakness (generalized): Secondary | ICD-10-CM

## 2018-06-09 DIAGNOSIS — R293 Abnormal posture: Secondary | ICD-10-CM

## 2018-06-09 DIAGNOSIS — M546 Pain in thoracic spine: Secondary | ICD-10-CM

## 2018-06-09 NOTE — Telephone Encounter (Signed)
Patient no-showed to his appointment scheduled yesterday at 5pm. Called today and left message requesting he call if he is not planning to continue PT and informing him of his next appointment. Pt called back while documenting the call. We discussed his options for care and what could be done to prevent him from getting migraine headaches after exercise. He elected to discontinue PT at this time and call us back if he would like to continue in the future. States he needs to figure out a better way to stay active between sessions to make a 1-2x/month schedule for PT work after I explained to him that PT would likely not be very effective if he is unable to complete his HEP and fitness program in between visits.

## 2018-06-09 NOTE — Therapy (Signed)
[facility] [address] [place], [address] Phone: [phone]   Fax:  [phone]  Physical Therapy Discharge Summary Reporting period: 03/11/2018 through 06/09/2018  Patient Details  Name: Eric Thomas MRN: [id number] Date of Birth: January 19, 2001 Referring Provider (PT): Magnus Sinning, MD    Encounter Date: 06/09/2018    Past Medical History:  Diagnosis Date  . Arachnodactyly   . Mitral insufficiency   . Scoliosis     No past surgical history on file.  There were no vitals filed for this visit.  Subjective Assessment - 06/09/18 1511    Subjective  Spoke to pt on phone after he missed his last appointment. He reported getting a strong migraine for 2 days following last treatment session and states he has not had this happen in years but it used to happen after exerting himself strongly. He expects it came from exercising too hard at last physical therapy session. After a disscusion of ways to prevent this from happening and a disscussion about the effectiveness of PT 1-2x/month pt elected to discontinue PT at this time due to inability to attend more frequently and feeling it was unrealistic for him to keep up with physical activity between PT sessions.     Pertinent History  thoracolumbar scoliosis     Limitations  Lifting;Sitting    Patient Stated Goals  decrease pain with work and school     Pain Onset  More than a month ago        PT Education - 06/09/18 1514    Education Details  educated pt over phone about different options in physical therapy and expectations for PT to be effective.     Person(s) Educated  Patient    Methods  Explanation    Comprehension  Verbalized understanding       PT Short Term Goals - 06/09/18 1516      PT SHORT TERM GOAL #1   Title  Pt will demo consistency and independence with his initial HEP to improve posture and decrease pain.    Baseline  pt admits that he  continues to lack consistency in HEP (06/09/2018).    Time  4    Period  Weeks    Status  Not Met    Target Date  04/11/18      PT SHORT TERM GOAL #2   Title  Pt will be able to verbalize pain management techniques throughout the day to decrease pain following a busy school/work day.    Baseline  05/04/18: pt verbalizes home strategies     Time  4    Period  Weeks    Status  Achieved        PT Long Term Goals - 06/09/18 1517      PT LONG TERM GOAL #1   Title  Pt will be able to maintain plank on elbows/feet for atleast 15 sec at a time, 2/3 trials, without compensation, to reflect improvements in trunk strength and endurance.     Baseline  05/04/18: Front plank on elbows 30s, 20s, 25s, compensatory strategy noted with trials 2 and 3;    Time  8    Period  Weeks    Status  Partially Met    Target Date  06/29/18      PT LONG TERM GOAL #2   Title  Pt will demo improved hamstring flexibility to lacking no more than 30 deg with 90/90 testing to improve his sitting  alignment during class.     Time  8    Period  Weeks    Status  Unable to assess      PT LONG TERM GOAL #3   Title  Pt will report atleast 60% improvement in his back pain from the start of PT to allow him to work a shift without limitation.     Baseline  05/04/18: 20% improvement    Time  8    Period  Weeks    Status  Partially Met    Target Date  06/29/18      PT LONG TERM GOAL #4   Title  Pt will demo proper body mechanics with lifting 10# box from the floor x5 reps without the need for cuing from the therapist.     Time  8    Period  Weeks    Status  Unable to assess            Plan - 06/09/18 1522    Clinical Impression Statement  Patient has attended 5 physical therapy appointments and has not made good progress towards goals. He is now being discharged due to inability to attend frequently enough to make a difference without being more consistent with HEP and assigned fitness activities. At last visit  patient fatigued very quickly and had difficulty controlling posture. He was unable to tolerate as much weight for rows and presses compared to the visit prior and it appears that he was minimally completing HEP or partially completing HEP resulting in regression over time instead of improved tolerance for activity. Pt reported strong migraine headache that prevented him from attending school for 2 days following last treatment session and has decided he would like to discontinue PT at this point. He is now discharged from physical therapy.    Rehab Potential  Good    PT Frequency  2x / week    PT Duration  8 weeks    PT Treatment/Interventions  ADLs/Self Care Home Management;Cryotherapy;Electrical Stimulation;Moist Heat;Therapeutic activities;Therapeutic exercise;Neuromuscular re-education;Patient/family education;Manual techniques;Taping;Dry needling;Passive range of motion;Traction;Ultrasound;Iontophoresis 4mg/ml Dexamethasone    PT Next Visit Plan  Patient is now discharged from physical therapy.     PT Home Exercise Plan  [email or web address]    Consulted and Agree with Plan of Care  Patient       Patient will benefit from skilled therapeutic intervention in order to improve the following deficits and impairments:  Decreased activity tolerance, Decreased safety awareness, Decreased strength, Impaired flexibility, Postural dysfunction, Pain, Improper body mechanics, Decreased range of motion, Increased muscle spasms  Visit Diagnosis: Pain in thoracic spine  Abnormal posture  Muscle weakness (generalized)     Problem List Patient Active Problem List   Diagnosis Date Noted  . Chest pain, musculoskeletal 12/03/2016  . Arachnodactyly 03/07/2013  . Mitral insufficiency 03/07/2013  . Palpitations 03/07/2013  . Scoliosis (and kyphoscoliosis), idiopathic 06/15/2012    Sara R Snyder, PT, DPT 06/09/2018, 3:24 PM  [facility] [address] [place], [place], [address] Phone: [phone]   Fax:  [phone]  Name: Eric Thomas MRN: [id number] Date of Birth: 11/24/2000   

## 2018-06-17 MED FILL — VYVANSE 30 MG CAPSULE: 30 | 30 days supply | Qty: 30 | Fill #0

## 2018-06-22 ENCOUNTER — Encounter: Payer: No Typology Code available for payment source | Admitting: Physical Therapy

## 2018-06-22 MED FILL — CLINDAMYCIN PHOS-BENZOYL PE: 1-5 | 30 days supply | Qty: 25 | Fill #0

## 2018-07-06 ENCOUNTER — Encounter: Payer: No Typology Code available for payment source | Admitting: Physical Therapy

## 2018-07-20 ENCOUNTER — Encounter: Payer: No Typology Code available for payment source | Admitting: Physical Therapy

## 2018-07-25 MED FILL — VYVANSE 30 MG CAPSULE: 30 | 30 days supply | Qty: 30 | Fill #0

## 2018-08-17 MED FILL — CONCERTA 18 MG TABLET ER: 18 | 30 days supply | Qty: 30 | Fill #0

## 2018-10-17 MED FILL — ADHANSIA XR 25 MG CP24: 25 | 30 days supply | Qty: 30 | Fill #0

## 2018-12-07 MED FILL — ADHANSIA XR 25 MG CP24: 25 | 30 days supply | Qty: 30 | Fill #0

## 2018-12-09 MED FILL — ERYTHROMYCIN-BENZOYL GEL: 5-3 | 30 days supply | Qty: 23 | Fill #0

## 2019-02-02 MED FILL — ERYTHROMYCIN-BENZOYL GEL: 5-3 | 30 days supply | Qty: 23 | Fill #1

## 2019-03-07 ENCOUNTER — Other Ambulatory Visit: Payer: Self-pay | Admitting: *Deleted

## 2019-03-07 DIAGNOSIS — Z20822 Contact with and (suspected) exposure to covid-19: Secondary | ICD-10-CM

## 2019-03-09 LAB — NOVEL CORONAVIRUS, NAA: SARS-CoV-2, NAA: NOT DETECTED

## 2019-04-13 MED FILL — CLINDAMYCIN HCL 300 MG CAPS: 300 | 10 days supply | Qty: 30 | Fill #0

## 2019-04-25 MED FILL — ERYTHROMYCIN-BENZOYL GEL: 5-3 | 30 days supply | Qty: 23 | Fill #2

## 2019-05-11 MED FILL — FLUARIX QUADRIVALENT 0.5 ML: 0.5 | 1 days supply | Qty: 1 | Fill #0

## 2019-09-20 MED FILL — ADHANSIA XR 25 MG CP24: 25 | 30 days supply | Qty: 30 | Fill #0

## 2019-10-05 MED FILL — ADHANSIA XR 25 MG CP24: 25 | 30 days supply | Qty: 30 | Fill #0

## 2020-03-12 MED FILL — ADHANSIA XR 25 MG CP24: 25 | 30 days supply | Qty: 30 | Fill #0

## 2020-04-30 MED FILL — ADHANSIA XR 25 MG CP24: 25 | 30 days supply | Qty: 30 | Fill #0

## 2020-06-06 ENCOUNTER — Other Ambulatory Visit: Payer: Self-pay

## 2020-06-06 ENCOUNTER — Ambulatory Visit: Payer: No Typology Code available for payment source | Attending: Family Medicine | Admitting: Physical Therapy

## 2020-06-06 ENCOUNTER — Other Ambulatory Visit (HOSPITAL_COMMUNITY): Payer: Self-pay | Admitting: Family Medicine

## 2020-06-06 ENCOUNTER — Encounter: Payer: Self-pay | Admitting: Physical Therapy

## 2020-06-06 DIAGNOSIS — M542 Cervicalgia: Secondary | ICD-10-CM | POA: Diagnosis present

## 2020-06-06 DIAGNOSIS — M6281 Muscle weakness (generalized): Secondary | ICD-10-CM | POA: Diagnosis present

## 2020-06-06 DIAGNOSIS — R293 Abnormal posture: Secondary | ICD-10-CM | POA: Insufficient documentation

## 2020-06-06 DIAGNOSIS — M546 Pain in thoracic spine: Secondary | ICD-10-CM | POA: Insufficient documentation

## 2020-06-06 MED FILL — SUMAtriptan SUCCINATE 50 MG: 50 | 30 days supply | Qty: 12 | Fill #0

## 2020-06-06 NOTE — Therapy (Addendum)
[facility] [address], [address] [place], [place], [address] Phone: [phone]   Fax:  [phone]  Physical Therapy Evaluation  Patient Details  Name: Eric Thomas MRN: [id number] Date of Birth: Dec 16, 2000 Referring Provider (PT): Farris Has, MD   Encounter Date: 06/06/2020   PT End of Session - 06/06/20 1340    Visit Number 1    Date for PT Re-Evaluation 08/01/20    PT Start Time 0932    PT Stop Time 1016    PT Time Calculation (min) 44 min    Activity Tolerance Patient tolerated treatment well    Behavior During Therapy [facility] for tasks assessed/performed           Past Medical History:  Diagnosis Date  . Arachnodactyly   . Mitral insufficiency   . Scoliosis     History reviewed. No pertinent surgical history.  There were no vitals filed for this visit.    Subjective Assessment - 06/06/20 0936    Subjective Pt states this is an ongoing issue that has flared up since going back to school in person.  Pt states the worst it gets is 6/10 and usually 4/10.  Gets a little better after moving but then gets aggitated driving or sitting in class.    Currently in Pain? Yes    Pain Score 4     Pain Location Neck    Pain Orientation Left;Mid    Pain Descriptors / Indicators Burning    Pain Type Chronic pain    Pain Radiating Towards into the Lt shoulder; more sharp on lower left side    Pain Onset More than a month ago    Pain Frequency Intermittent    Aggravating Factors  sitting and driving    Pain Relieving Factors resting against a chair or lying down;    Effect of Pain on Daily Activities turning head in class and driving    Multiple Pain Sites No              [facility] PT Assessment - 06/10/20 0001      Assessment   Medical Diagnosis M54.2 (ICD-10-CM) - Cervicalgia;M54.9 (ICD-10-CM) - Dorsalgia, unspecified;Z82.69 (ICD-10-CM) - FH: scoliosis    Referring Provider (PT) Farris Has, MD    Onset  Date/Surgical Date --   since I can remember   Hand Dominance Right      Precautions   Precautions None      Restrictions   Weight Bearing Restrictions No      Balance Screen   Has the patient fallen in the past 6 months No      Home Environment   Living Environment Private residence    Living Arrangements Parent      Prior Function   Level of Independence Independent    Vocation Student    Leisure exercises with previous HEP      Cognition   Overall Cognitive Status Within Functional Limits for tasks assessed      Observation/Other Assessments   Observations S-curvature Lt shoulder 25 degrees below neutral; Rt shoulder 19 deg below neutral    Scoliosis Yes    Focus on Therapeutic Outcomes (FOTO)  48% limited; goal 34%      Posture/Postural Control   Posture/Postural Control Postural limitations    Postural Limitations Forward head;Decreased lumbar lordosis;Decreased thoracic kyphosis    Posture Comments see above observations      AROM   Overall AROM Comments fwd flexion limited 50% due to hamstring  tension      Strength   Overall Strength Comments Rt UE 5/5; Lt 4+/5;       Palpation   Palpation comment upper traps and cervical paraspinals tight and TTP; thoracic parapsinlas tight      Special Tests    Special Tests Cervical    Cervical Tests Spurling's;Dictraction      Spurling's   Findings Negative      Distraction Test   Findngs Negative      Ambulation/Gait   Gait Pattern Decreased stride length;Decreased arm swing - right;Decreased arm swing - left                      Objective measurements completed on examination: See above findings.       [facility] Adult PT Treatment/Exercise - 06/10/20 0001      Self-Care   Self-Care Other Self-Care Comments    Other Self-Care Comments  tennis ball massage to rhomboids and upper traps and dry needling info      Manual Therapy   Manual Therapy Soft tissue mobilization    Soft tissue mobilization  upper traps bilaterally            Trigger Point Dry Needling - 06/10/20 0001    Consent Given? Yes    Education Handout Provided Yes    Muscles Treated Head and Neck Upper trapezius    Dry Needling Comments bilat cervial paraspinals; Lt upper trap    Upper Trapezius Response Palpable increased muscle length;Twitch reponse elicited                  PT Short Term Goals - 06/06/20 1354      PT SHORT TERM GOAL #1   Title Pt will demo consistency and independence with his initial HEP to improve posture and decrease pain.    Time 4    Period Weeks    Status New    Target Date 07/04/20      PT SHORT TERM GOAL #2   Title Pt will be ind with trigger point release using tennis ball    Time 4    Period Weeks    Status New    Target Date 07/04/20             PT Long Term Goals - 06/06/20 1342      PT LONG TERM GOAL #1   Title Pt will demonstrate improved posture with Lt shoulder slope of less than 20 deg    Baseline 25 deg    Time 8    Period Weeks    Target Date 08/01/20      PT LONG TERM GOAL #2   Title Pt will report feeling better with turning his head while driving or in class and demonstrate cervical rotation of at least 55 deg bilaterally    Time 8    Period Weeks    Status New    Target Date 08/01/20      PT LONG TERM GOAL #3   Title Pt will report atleast 60% improvement in his back pain from the start of PT to allow him to sit in class without limitation    Time 8    Period Weeks    Status New    Target Date 08/01/20      PT LONG TERM GOAL #4   Title ind with HEP for maintaining improved posture and pain management    Time 8    Period Weeks  Status New    Target Date 08/01/20                  Plan - 06/10/20 1816    Clinical Impression Statement Pt presents to skilled PT with chronic back and neck pain beginning in the upper thoracic region. Pt has been to PT previously and found it helped. He is now back in school and flared up  his pain . Pt had dry needling in the past and found it helpful. Pt has severe scoliosis of the spine with S-curve to the Rt in thoracic and Lt lumbar. Pt's presents with posture abnormalities as mentioned above. Pt has limited cervial ROM, Lt shoulder mildly weak. Pt has decreased mobility of thoracic flexion. Hamstrings tight and thoracic flexion limited. Pt has tight upper traps Lt>Rt, tight cervical and thoracic paraspinals. Pt will benefit from skilled PT to address impairments and maximize function.    Personal Factors and Comorbidities Comorbidity 1    Comorbidities scoliosis    Examination-Activity Limitations Sit    Examination-Participation Restrictions School;Driving    Stability/Clinical Decision Making Evolving/Moderate complexity    Rehab Potential Excellent    PT Frequency 2x / week    PT Duration 8 weeks    PT Treatment/Interventions ADLs/Self Care Home Management;Biofeedback;Cryotherapy;Electrical Stimulation;Moist Heat;Traction;Neuromuscular re-education;Therapeutic exercise;Therapeutic activities;Patient/family education;Manual techniques;Passive range of motion;Dry needling;Taping    PT Next Visit Plan f/u on DN to Lt upper trap and cervical paraspinals and tennis ball massage; STM and thoracic mobility    PT Home Exercise Plan tennis ball massage    Consulted and Agree with Plan of Care Patient           Patient will benefit from skilled therapeutic intervention in order to improve the following deficits and impairments:  Pain, Postural dysfunction, Impaired flexibility, Increased fascial restricitons, Decreased strength, Decreased range of motion, Increased muscle spasms  Visit Diagnosis: Pain in thoracic spine  Abnormal posture  Muscle weakness (generalized)  Cervicalgia     Problem List Patient Active Problem List   Diagnosis Date Noted  . Chest pain, musculoskeletal 12/03/2016  . Arachnodactyly 03/07/2013  . Mitral insufficiency 03/07/2013  .  Palpitations 03/07/2013  . Scoliosis (and kyphoscoliosis), idiopathic 06/15/2012    Junious Silk, PT 06/10/2020, 6:16 PM  [facility] [address] [place], [place], [address] Phone: [phone]   Fax:  [phone]  Name: Corde Antonini MRN: [id number] Date of Birth: 05/11/2001

## 2020-06-10 NOTE — Addendum Note (Signed)
Addended by: Beatris Si on: 06/10/2020 06:18 PM   Modules accepted: Orders

## 2020-06-13 ENCOUNTER — Ambulatory Visit: Payer: No Typology Code available for payment source | Admitting: Physical Therapy

## 2020-06-13 ENCOUNTER — Other Ambulatory Visit: Payer: Self-pay

## 2020-06-13 DIAGNOSIS — M6281 Muscle weakness (generalized): Secondary | ICD-10-CM

## 2020-06-13 DIAGNOSIS — M546 Pain in thoracic spine: Secondary | ICD-10-CM

## 2020-06-13 DIAGNOSIS — M542 Cervicalgia: Secondary | ICD-10-CM

## 2020-06-13 DIAGNOSIS — R293 Abnormal posture: Secondary | ICD-10-CM

## 2020-06-13 NOTE — Therapy (Signed)
[facility] [address] [place], [place], [address] Phone: [phone]   Fax:  [phone]  Physical Therapy Treatment  Patient Details  Name: Eric Thomas MRN: [id number] Date of Birth: 22-Jan-2001 Referring Provider (PT): Farris Has, MD   Encounter Date: 06/13/2020   PT End of Session - 06/13/20 1654    Visit Number 2    Date for PT Re-Evaluation 08/01/20    PT Start Time 1612    PT Stop Time 1645    PT Time Calculation (min) 33 min    Activity Tolerance Patient tolerated treatment well           Past Medical History:  Diagnosis Date  . Arachnodactyly   . Mitral insufficiency   . Scoliosis     No past surgical history on file.  There were no vitals filed for this visit.   Subjective Assessment - 06/13/20 1608    Subjective The DN worked well.  No pain right now.  A little in the car but not bad.    Currently in Pain? No/denies    Pain Score 0-No pain    Pain Orientation Left                             [facility] Adult PT Treatment/Exercise - 06/13/20 0001      Neck Exercises: Machines for Strengthening   UBE (Upper Arm Bike) 3 min FW/BW    Cybex Row 30# 15x standing     Lat Pull standing 25# 15x       Neck Exercises: Standing   Neck Retraction 5 reps    Other Standing Exercises counter top push ups 15x     Other Standing Exercises wall green loop scoops and wall clocks 5x each       Neck Exercises: Supine   Other Supine Exercise vertical on foam roll, chin tuck with UE elevation 3x 5       Neck Exercises: Prone   Other Prone Exercise head lift with UE extension, horizontal abduction and flexion 5x each     Other Prone Exercise quadruped cervical retraction against red band 10x                     PT Short Term Goals - 06/06/20 1354      PT SHORT TERM GOAL #1   Title Pt will demo consistency and independence with his initial HEP to improve posture and  decrease pain.    Time 4    Period Weeks    Status New    Target Date 07/04/20      PT SHORT TERM GOAL #2   Title Pt will be ind with trigger point release using tennis ball    Time 4    Period Weeks    Status New    Target Date 07/04/20             PT Long Term Goals - 06/06/20 1342      PT LONG TERM GOAL #1   Title Pt will demonstrate improved posture with Lt shoulder slope of less than 20 deg    Baseline 25 deg    Time 8    Period Weeks    Target Date 08/01/20      PT LONG TERM GOAL #2   Title Pt will report feeling better with turning his head while driving or in class and demonstrate cervical rotation  of at least 55 deg bilaterally    Time 8    Period Weeks    Status New    Target Date 08/01/20      PT LONG TERM GOAL #3   Title Pt will report atleast 60% improvement in his back pain from the start of PT to allow him to sit in class without limitation    Time 8    Period Weeks    Status New    Target Date 08/01/20      PT LONG TERM GOAL #4   Title ind with HEP for maintaining improved posture and pain management    Time 8    Period Weeks    Status New    Target Date 08/01/20                 Plan - 06/13/20 1655    Clinical Impression Statement The patient declines the need for DN today.  He is able to perform postural strengthening ex's with reports of mild discomfort around C7.  Discussed with patient that this is the result of strengthening muscles that are normally on constant stretch (protruded head forward posture).  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  Therapist monitoring response with all interventions.    Personal Factors and Comorbidities Comorbidity 1    Comorbidities scoliosis    Rehab Potential Excellent    PT Frequency 2x / week    PT Duration 8 weeks    PT Treatment/Interventions ADLs/Self Care Home Management;Biofeedback;Cryotherapy;Electrical Stimulation;Moist  Heat;Traction;Neuromuscular re-education;Therapeutic exercise;Therapeutic activities;Patient/family education;Manual techniques;Passive range of motion;Dry needling;Taping    PT Next Visit Plan DN as needed to left upper trap and cervical paraspinals;  manual therapy; postural strengthening           Patient will benefit from skilled therapeutic intervention in order to improve the following deficits and impairments:  Pain, Postural dysfunction, Impaired flexibility, Increased fascial restricitons, Decreased strength, Decreased range of motion, Increased muscle spasms  Visit Diagnosis: Pain in thoracic spine  Abnormal posture  Muscle weakness (generalized)  Cervicalgia     Problem List Patient Active Problem List   Diagnosis Date Noted  . Chest pain, musculoskeletal 12/03/2016  . Arachnodactyly 03/07/2013  . Mitral insufficiency 03/07/2013  . Palpitations 03/07/2013  . Scoliosis (and kyphoscoliosis), idiopathic 06/15/2012   Lavinia Sharps, PT 06/13/20 5:02 PM Phone: [phone] Fax: [phone] Vivien Presto 06/13/2020, Deno Etienne PM  [facility] [address] [place], [place], [address] Phone: [phone]   Fax:  [phone]  Name: Eric Thomas MRN: [id number] Date of Birth: 29-Nov-2000

## 2020-06-14 ENCOUNTER — Other Ambulatory Visit (HOSPITAL_COMMUNITY): Payer: Self-pay | Admitting: Physician Assistant

## 2020-06-14 MED FILL — ADHANSIA XR 25 MG CP24: 25 | 30 days supply | Qty: 30 | Fill #0

## 2020-06-25 ENCOUNTER — Encounter: Payer: No Typology Code available for payment source | Admitting: Physical Therapy

## 2020-06-25 ENCOUNTER — Other Ambulatory Visit: Payer: Self-pay | Admitting: Rehabilitative and Restorative Service Providers"

## 2020-06-25 DIAGNOSIS — I340 Nonrheumatic mitral (valve) insufficiency: Secondary | ICD-10-CM

## 2020-07-02 ENCOUNTER — Ambulatory Visit
Admission: RE | Admit: 2020-07-02 | Discharge: 2020-07-02 | Disposition: A | Discharge status: Home / Self Care | Payer: No Typology Code available for payment source | Source: Ambulatory Visit | Attending: Rehabilitative and Restorative Service Providers" | Admitting: Rehabilitative and Restorative Service Providers"

## 2020-07-02 ENCOUNTER — Other Ambulatory Visit: Payer: Self-pay

## 2020-07-02 DIAGNOSIS — I340 Nonrheumatic mitral (valve) insufficiency: Secondary | ICD-10-CM | POA: Diagnosis not present

## 2020-07-02 NOTE — Progress Notes (Signed)
*  PRELIMINARY RESULTS* Echocardiogram 2D Echocardiogram has been performed.  Eric Thomas Eric Thomas 07/02/2020, 11:26 AM

## 2020-07-09 ENCOUNTER — Ambulatory Visit: Payer: No Typology Code available for payment source | Admitting: Physical Therapy

## 2020-07-16 ENCOUNTER — Encounter: Payer: No Typology Code available for payment source | Admitting: Physical Therapy

## 2020-07-25 ENCOUNTER — Other Ambulatory Visit: Payer: Self-pay

## 2020-07-25 ENCOUNTER — Ambulatory Visit: Payer: No Typology Code available for payment source | Attending: Family Medicine | Admitting: Physical Therapy

## 2020-07-25 DIAGNOSIS — R293 Abnormal posture: Secondary | ICD-10-CM | POA: Diagnosis present

## 2020-07-25 DIAGNOSIS — M546 Pain in thoracic spine: Secondary | ICD-10-CM | POA: Insufficient documentation

## 2020-07-25 DIAGNOSIS — M6281 Muscle weakness (generalized): Secondary | ICD-10-CM | POA: Diagnosis present

## 2020-07-25 DIAGNOSIS — M542 Cervicalgia: Secondary | ICD-10-CM | POA: Insufficient documentation

## 2020-07-25 NOTE — Patient Instructions (Signed)
Access Code: [email or web address] URL: [email or web address] Date: 07/25/2020 Prepared by: Lavinia Sharps  Exercises Supine Hamstring Stretch - 2 x daily - 7 x weekly - 10 reps - 10 hold Doorway Rhomboid Stretch - 2 x daily - 7 x weekly - 3 reps - 1 sets - 30 hold Correct Seated Posture - 1 x daily - 7 x weekly - 10 reps - 3 sets Supine Lower Trunk Rotation - 2 x daily - 7 x weekly - 10 reps - 1 sets Seated Shoulder Horizontal Abduction with Resistance - 2 x daily - 4 x weekly - 10 reps - 2 sets Standing Serratus Punch with Resistance - 2 x daily - 4 x weekly - 10 reps - 2 sets Standing Row with Anchored Resistance - 2 x daily - 4 x weekly - 10 reps - 2 sets Standing Shoulder Horizontal and Diagonal Pulls with Resistance - 2 x daily - 4 x weekly - 15 reps - 1 sets Push Up on Table - 2 x daily - 4 x weekly - 10 reps - 2 sets Standard Plank - 1 x daily - 4 x weekly - 3 reps - 1 sets - 30 hold Side Plank on Elbow - 1 x daily - 4 x weekly - 3 reps - 30 second hold Bird Dog - 1 x daily - 4 x weekly - 8 reps - 2 sets - 1 second hold Supine Single Leg Lift - 1 x daily - 4 x weekly - 3 reps - 1 sets Prone Chest Lift with Arms Overhead - 1 x daily - 4 x weekly - 10 reps - 1 sets Seated Hamstring Stretch - 1 x daily - 7 x weekly - 3 sets - 3 reps - 20 hold Standing Hamstring Stretch with Step - 1 x daily - 7 x weekly - 3 sets - 3 reps - 20 hold Standing Hip Hinge with Dowel - 1 x daily - 7 x weekly - 1 sets - 10 reps Half Dead Lift with Kettlebell - 1 x daily - 7 x weekly - 1 sets - 10 reps Standing Upright Shoulder Row with Resistance - 1 x daily - 7 x weekly - 1 sets - 10 reps Standing Single Arm Shoulder Press with Dumbbell - 1 x daily - 7 x weekly - 1 sets - 10 reps

## 2020-07-25 NOTE — Therapy (Addendum)
[facility] [address], [place], [place], [address] Phone: [phone]   Fax:  [phone]  Physical Therapy Treatment/Recertification/Discharge summary   Patient Details  Name: Eric Thomas MRN: [id number] Date of Birth: 06/29/2001 Referring Provider (PT): London Pepper, MD   Encounter Date: 07/25/2020   PT End of Session - 07/25/20 1355    Visit Number 3    Date for PT Re-Evaluation 09/06/20    PT Start Time 1227    PT Stop Time 1314    PT Time Calculation (min) 47 min    Activity Tolerance Patient tolerated treatment well           Past Medical History:  Diagnosis Date  . Arachnodactyly   . Mitral insufficiency   . Scoliosis     No past surgical history on file.  There were no vitals filed for this visit.   Subjective Assessment - 07/25/20 1231    Subjective No pain since finished classes.  Been looking for jobs.  Last had pain last week.    Currently in Pain? No/denies    Pain Score 0-No pain              [facility] PT Assessment - 07/25/20 0001      Observation/Other Assessments   Focus on Therapeutic Outcomes (FOTO)  40% limitation      AROM   Overall AROM Comments Lumbar flexion 35 degrees;  cervical rotation 60 degrees right. 65 degrees left                         [facility] Adult PT Treatment/Exercise - 07/25/20 0001      Therapeutic Activites    Lifting hip hinge with golf club;  10# dead lift from knee level 10x; mid shin 10x      Lumbar Exercises: Stretches   Active Hamstring Stretch Right;Left;2 reps;20 seconds    Active Hamstring Stretch Limitations seated    Passive Hamstring Stretch Right;Left;2 reps;20 seconds    Passive Hamstring Stretch Limitations on 2nd step      Lumbar Exercises: Standing   Other Standing Lumbar Exercises blue band upward row with squat 10x    Other Standing Lumbar Exercises review of current HEP on [employer]      Shoulder Exercises:  Standing   Other Standing Exercises 5# snatch to shoulder than overhead press 10x right/left      Shoulder Exercises: ROM/Strengthening   Other ROM/Strengthening Exercises bike 6 min while discussing status/progress                  PT Education - 07/25/20 1347    Education Details seated and standing HS stretches;  hip hinge using yard stick; dead lifts; overhead press    Person(s) Educated Patient    Methods Explanation;Demonstration;Handout    Comprehension Returned demonstration;Verbalized understanding            PT Short Term Goals - 07/25/20 1715      PT SHORT TERM GOAL #1   Title Pt will demo consistency and independence with his initial HEP to improve posture and decrease pain.    Status Achieved      PT SHORT TERM GOAL #2   Title Pt will be ind with trigger point release using tennis ball    Status Achieved             PT Long Term Goals - 07/25/20 1715      PT LONG TERM  GOAL #1   Title Pt will demonstrate improved posture with Lt shoulder slope of less than 20 deg    Time 6    Period Weeks    Status On-going    Target Date 09/06/20      PT LONG TERM GOAL #2   Title Pt will report feeling better with turning his head while driving or in class and demonstrate cervical rotation of at least 55 deg bilaterally    Status Achieved      PT LONG TERM GOAL #3   Title Pt will report atleast 60% improvement in his back pain from the start of PT to allow him to sit in class without limitation    Time 6    Period Weeks    Status On-going      PT LONG TERM GOAL #4   Title ind with HEP for maintaining improved posture and pain management    Time 8    Period Weeks    Status On-going                 Plan - 07/25/20 1408    Clinical Impression Statement The patient reports pain improvement overall with last occurrence 1 week ago.  At 3 visits, his FOTO score has decreased to 40% limitation. His is most limited in moderate to vigorous activities.    Cervical rotation ROM improved significantly bilaterally.   His hamstring length is quite limited and restricts lumbar flexion ROM.  No dry needling performed today since no pain in 1 week but he reports this helped his neck pain initially and he may need this intervention and manual therapy in the future.  He demonstrates good compliance with his current HEP of stretching and body weight strengthening.  Instructed in a progression of strengthening training using free weights/kettlebells.   He reports some LB discomfort with dead lifting but low in intensity.  Recommend 2-3 more visits to achieve remaining rehab goals.    Comorbidities scoliosis    Examination-Participation Restrictions School;Driving    Rehab Potential Excellent    PT Frequency 1x / week    PT Duration 6 weeks    PT Treatment/Interventions ADLs/Self Care Home Management;Biofeedback;Cryotherapy;Electrical Stimulation;Moist Heat;Traction;Neuromuscular re-education;Therapeutic exercise;Therapeutic activities;Patient/family education;Manual techniques;Passive range of motion;Dry needling;Taping    PT Next Visit Plan DN as needed to left upper trap and cervical paraspinals;  manual therapy; postural strengthening    Consulted and Agree with Plan of Care Patient           Patient will benefit from skilled therapeutic intervention in order to improve the following deficits and impairments:  Pain,Postural dysfunction,Impaired flexibility,Increased fascial restricitons,Decreased strength,Decreased range of motion,Increased muscle spasms  Visit Diagnosis: Pain in thoracic spine - Plan: PT plan of care cert/re-cert  Abnormal posture - Plan: PT plan of care cert/re-cert  Muscle weakness (generalized) - Plan: PT plan of care cert/re-cert  Cervicalgia - Plan: PT plan of care cert/re-cert    PHYSICAL THERAPY DISCHARGE SUMMARY  Visits from Start of Care: 3  Current functional level related to goals / functional outcomes: The patient  cancelled several appts due to conflicts and then cancelled his last appt stating he was feeling better.     Remaining deficits: As above   Education / Equipment: HEP Plan: Patient agrees to discharge.  Patient goals were partially met. Patient is being discharged due to the patient's request.  ?????         Problem List Patient Active Problem List   Diagnosis  Date Noted  . Chest pain, musculoskeletal 12/03/2016  . Arachnodactyly 03/07/2013  . Mitral insufficiency 03/07/2013  . Palpitations 03/07/2013  . Scoliosis (and kyphoscoliosis), idiopathic 06/15/2012   Ruben Im, PT 07/25/20 5:23 PM Phone: [phone] Fax: [phone] Alvera Singh 07/25/2020, Lajuan Lines PM  [facility] [address] [place], [place], [address] Phone: [phone]   Fax:  [phone]  Name: Eric Thomas MRN: [id number] Date of Birth: [birth year]

## 2020-07-31 ENCOUNTER — Other Ambulatory Visit (HOSPITAL_COMMUNITY): Payer: Self-pay | Admitting: Physician Assistant

## 2020-07-31 MED FILL — ADHANSIA XR 25 MG CP24: 25 | 30 days supply | Qty: 30 | Fill #0

## 2020-08-01 ENCOUNTER — Encounter: Payer: No Typology Code available for payment source | Admitting: Physical Therapy

## 2020-09-12 ENCOUNTER — Other Ambulatory Visit (HOSPITAL_COMMUNITY): Payer: Self-pay | Admitting: Physician Assistant

## 2020-09-12 MED FILL — ADHANSIA XR 25 MG CP24: 25 | 30 days supply | Qty: 30 | Fill #0

## 2020-10-23 ENCOUNTER — Other Ambulatory Visit (HOSPITAL_COMMUNITY): Payer: Self-pay | Admitting: Physician Assistant

## 2020-10-23 MED FILL — ADHANSIA XR 25 MG CP24: 25 | 30 days supply | Qty: 30 | Fill #0

## 2020-12-02 ENCOUNTER — Other Ambulatory Visit (HOSPITAL_COMMUNITY): Payer: Self-pay

## 2020-12-03 ENCOUNTER — Other Ambulatory Visit (HOSPITAL_COMMUNITY): Payer: Self-pay

## 2020-12-04 ENCOUNTER — Other Ambulatory Visit (HOSPITAL_COMMUNITY): Payer: Self-pay

## 2020-12-05 ENCOUNTER — Other Ambulatory Visit (HOSPITAL_COMMUNITY): Payer: Self-pay

## 2020-12-06 ENCOUNTER — Other Ambulatory Visit (HOSPITAL_COMMUNITY): Payer: Self-pay

## 2020-12-10 ENCOUNTER — Other Ambulatory Visit (HOSPITAL_COMMUNITY): Payer: Self-pay

## 2020-12-18 ENCOUNTER — Other Ambulatory Visit (HOSPITAL_COMMUNITY): Payer: Self-pay

## 2020-12-18 DIAGNOSIS — Z79899 Other long term (current) drug therapy: Secondary | ICD-10-CM | POA: Diagnosis not present

## 2020-12-18 DIAGNOSIS — F900 Attention-deficit hyperactivity disorder, predominantly inattentive type: Secondary | ICD-10-CM | POA: Diagnosis not present

## 2020-12-18 DIAGNOSIS — F4010 Social phobia, unspecified: Secondary | ICD-10-CM | POA: Diagnosis not present

## 2020-12-18 MED ORDER — ADHANSIA XR 25 MG PO CP24
25.0000 mg | ORAL_CAPSULE | Freq: Every day | ORAL | 0 refills | Status: DC
  Filled 2020-12-18 (×2): qty 30, 30d supply, fill #0

## 2020-12-23 ENCOUNTER — Other Ambulatory Visit (HOSPITAL_COMMUNITY): Payer: Self-pay

## 2021-01-31 ENCOUNTER — Other Ambulatory Visit (HOSPITAL_COMMUNITY): Payer: Self-pay

## 2021-01-31 MED ORDER — ADHANSIA XR 25 MG PO CP24
25.0000 mg | ORAL_CAPSULE | Freq: Every day | ORAL | 0 refills | Status: AC
  Filled 2021-01-31: qty 30, 30d supply, fill #0

## 2021-02-02 MED FILL — Sumatriptan Succinate Tab 50 MG: ORAL | 25 days supply | Qty: 12 | Fill #0 | Status: AC

## 2021-02-04 ENCOUNTER — Other Ambulatory Visit (HOSPITAL_COMMUNITY): Payer: Self-pay

## 2021-02-04 MED ORDER — CARESTART COVID-19 HOME TEST VI KIT
PACK | 0 refills | Status: AC
  Filled 2021-02-04: qty 4, 4d supply, fill #0

## 2021-02-11 ENCOUNTER — Other Ambulatory Visit (HOSPITAL_COMMUNITY): Payer: Self-pay

## 2021-03-20 ENCOUNTER — Other Ambulatory Visit (HOSPITAL_COMMUNITY): Payer: Self-pay

## 2021-03-20 DIAGNOSIS — F900 Attention-deficit hyperactivity disorder, predominantly inattentive type: Secondary | ICD-10-CM | POA: Diagnosis not present

## 2021-03-20 DIAGNOSIS — Z79899 Other long term (current) drug therapy: Secondary | ICD-10-CM | POA: Diagnosis not present

## 2021-03-20 MED ORDER — METHYLPHENIDATE HCL ER (OSM) 27 MG PO TBCR
27.0000 mg | EXTENDED_RELEASE_TABLET | Freq: Every day | ORAL | 0 refills | Status: DC
  Filled 2021-03-20 (×2): qty 30, 30d supply, fill #0

## 2021-06-02 ENCOUNTER — Other Ambulatory Visit (HOSPITAL_COMMUNITY): Payer: Self-pay

## 2021-06-02 MED ORDER — METHYLPHENIDATE HCL ER (OSM) 27 MG PO TBCR
27.0000 mg | EXTENDED_RELEASE_TABLET | Freq: Every day | ORAL | 0 refills | Status: DC
  Filled 2021-06-02: qty 30, 30d supply, fill #0

## 2021-06-20 DIAGNOSIS — F902 Attention-deficit hyperactivity disorder, combined type: Secondary | ICD-10-CM | POA: Diagnosis not present

## 2021-06-20 DIAGNOSIS — Z79899 Other long term (current) drug therapy: Secondary | ICD-10-CM | POA: Diagnosis not present

## 2021-06-20 DIAGNOSIS — F900 Attention-deficit hyperactivity disorder, predominantly inattentive type: Secondary | ICD-10-CM | POA: Diagnosis not present

## 2021-07-11 ENCOUNTER — Other Ambulatory Visit (HOSPITAL_COMMUNITY): Payer: Self-pay

## 2021-07-11 MED ORDER — METHYLPHENIDATE HCL ER (OSM) 27 MG PO TBCR
27.0000 mg | EXTENDED_RELEASE_TABLET | Freq: Every day | ORAL | 0 refills | Status: DC
  Filled 2021-07-11: qty 30, 30d supply, fill #0

## 2021-08-28 ENCOUNTER — Other Ambulatory Visit (HOSPITAL_COMMUNITY): Payer: Self-pay

## 2021-08-28 MED ORDER — METHYLPHENIDATE HCL ER (OSM) 27 MG PO TBCR
27.0000 mg | EXTENDED_RELEASE_TABLET | Freq: Every day | ORAL | 0 refills | Status: DC
  Filled 2021-08-28: qty 30, 30d supply, fill #0

## 2021-10-09 ENCOUNTER — Other Ambulatory Visit (HOSPITAL_COMMUNITY): Payer: Self-pay

## 2021-10-09 ENCOUNTER — Other Ambulatory Visit: Payer: Self-pay

## 2021-10-09 MED ORDER — AMOXICILLIN 500 MG PO CAPS
ORAL_CAPSULE | ORAL | 0 refills | Status: AC
  Filled 2021-10-09: qty 21, 7d supply, fill #0

## 2021-10-09 MED ORDER — AMOXICILLIN 500 MG PO CAPS
500.0000 mg | ORAL_CAPSULE | ORAL | 0 refills | Status: AC
  Filled 2021-10-09: qty 21, 7d supply, fill #0

## 2021-10-10 ENCOUNTER — Other Ambulatory Visit: Payer: Self-pay

## 2021-10-13 ENCOUNTER — Other Ambulatory Visit (HOSPITAL_COMMUNITY): Payer: Self-pay

## 2021-10-13 MED ORDER — METHYLPHENIDATE HCL ER (OSM) 27 MG PO TBCR
27.0000 mg | EXTENDED_RELEASE_TABLET | Freq: Every day | ORAL | 0 refills | Status: DC
  Filled 2021-10-13: qty 30, 30d supply, fill #0

## 2021-10-14 ENCOUNTER — Other Ambulatory Visit (HOSPITAL_COMMUNITY): Payer: Self-pay

## 2021-12-19 ENCOUNTER — Other Ambulatory Visit (HOSPITAL_COMMUNITY): Payer: Self-pay

## 2021-12-19 MED ORDER — METHYLPHENIDATE HCL ER (OSM) 27 MG PO TBCR
27.0000 mg | EXTENDED_RELEASE_TABLET | Freq: Every day | ORAL | 0 refills | Status: DC
  Filled 2021-12-19: qty 30, 30d supply, fill #0

## 2022-02-06 ENCOUNTER — Other Ambulatory Visit (HOSPITAL_COMMUNITY): Payer: Self-pay

## 2022-02-06 MED ORDER — AMOXICILLIN 500 MG PO CAPS
500.0000 mg | ORAL_CAPSULE | Freq: Three times a day (TID) | ORAL | 0 refills | Status: AC
  Filled 2022-02-06: qty 21, 7d supply, fill #0

## 2022-03-03 ENCOUNTER — Other Ambulatory Visit (HOSPITAL_COMMUNITY): Payer: Self-pay

## 2022-03-03 MED ORDER — METHYLPHENIDATE HCL ER (OSM) 27 MG PO TBCR
27.0000 mg | EXTENDED_RELEASE_TABLET | Freq: Every day | ORAL | 0 refills | Status: DC
  Filled 2022-03-03: qty 30, 30d supply, fill #0

## 2022-03-06 ENCOUNTER — Other Ambulatory Visit (HOSPITAL_COMMUNITY): Payer: Self-pay

## 2022-03-06 MED ORDER — TRIAMCINOLONE ACETONIDE 0.1 % EX CREA
1.0000 | TOPICAL_CREAM | Freq: Two times a day (BID) | CUTANEOUS | 1 refills | Status: AC
  Filled 2022-03-06: qty 60, 30d supply, fill #0

## 2022-04-16 ENCOUNTER — Other Ambulatory Visit (HOSPITAL_COMMUNITY): Payer: Self-pay

## 2022-04-16 MED ORDER — METHYLPHENIDATE HCL ER (OSM) 27 MG PO TBCR
27.0000 mg | EXTENDED_RELEASE_TABLET | Freq: Every day | ORAL | 0 refills | Status: DC
  Filled 2022-04-16: qty 30, 30d supply, fill #0

## 2022-06-12 ENCOUNTER — Other Ambulatory Visit (HOSPITAL_COMMUNITY): Payer: Self-pay

## 2022-06-12 MED ORDER — METHYLPHENIDATE HCL ER (OSM) 27 MG PO TBCR
27.0000 mg | EXTENDED_RELEASE_TABLET | Freq: Every day | ORAL | 0 refills | Status: AC
  Filled 2022-06-12: qty 30, 30d supply, fill #0

## 2022-06-15 ENCOUNTER — Other Ambulatory Visit (HOSPITAL_COMMUNITY): Payer: Self-pay

## 2022-09-10 ENCOUNTER — Other Ambulatory Visit (HOSPITAL_COMMUNITY): Payer: Self-pay

## 2022-09-10 MED ORDER — AMOXICILLIN 500 MG PO CAPS
500.0000 mg | ORAL_CAPSULE | Freq: Three times a day (TID) | ORAL | 0 refills | Status: AC
  Filled 2022-09-10: qty 21, 7d supply, fill #0

## 2022-09-10 MED ORDER — ACETAMINOPHEN-CODEINE 300-30 MG PO TABS
1.0000 | ORAL_TABLET | ORAL | 0 refills | Status: AC | PRN
  Filled 2022-09-10: qty 10, 2d supply, fill #0

## 2022-09-17 ENCOUNTER — Other Ambulatory Visit (HOSPITAL_COMMUNITY): Payer: Self-pay

## 2022-09-17 MED ORDER — METHYLPHENIDATE HCL ER (OSM) 27 MG PO TBCR
27.0000 mg | EXTENDED_RELEASE_TABLET | Freq: Every day | ORAL | 0 refills | Status: AC
  Filled 2022-09-17: qty 30, 30d supply, fill #0

## 2022-11-06 ENCOUNTER — Other Ambulatory Visit: Payer: Self-pay

## 2022-11-11 ENCOUNTER — Other Ambulatory Visit (HOSPITAL_COMMUNITY): Payer: Self-pay

## 2022-11-11 MED ORDER — METHYLPHENIDATE HCL ER (OSM) 27 MG PO TBCR
27.0000 mg | EXTENDED_RELEASE_TABLET | Freq: Every day | ORAL | 0 refills | Status: AC
  Filled 2022-11-11 – 2022-11-27 (×2): qty 30, 30d supply, fill #0

## 2022-11-16 ENCOUNTER — Other Ambulatory Visit (HOSPITAL_COMMUNITY): Payer: Self-pay

## 2022-11-20 ENCOUNTER — Other Ambulatory Visit: Payer: Self-pay

## 2022-11-20 ENCOUNTER — Other Ambulatory Visit (HOSPITAL_COMMUNITY): Payer: Self-pay

## 2022-11-24 ENCOUNTER — Other Ambulatory Visit (HOSPITAL_COMMUNITY): Payer: Self-pay

## 2022-11-24 ENCOUNTER — Other Ambulatory Visit: Payer: Self-pay

## 2022-11-25 ENCOUNTER — Other Ambulatory Visit (HOSPITAL_COMMUNITY): Payer: Self-pay

## 2022-11-26 ENCOUNTER — Other Ambulatory Visit (HOSPITAL_COMMUNITY): Payer: Self-pay

## 2022-11-27 ENCOUNTER — Other Ambulatory Visit (HOSPITAL_COMMUNITY): Payer: Self-pay

## 2022-12-16 ENCOUNTER — Other Ambulatory Visit (HOSPITAL_COMMUNITY): Payer: Self-pay

## 2022-12-16 MED ORDER — METHYLPHENIDATE HCL ER (OSM) 27 MG PO TBCR
27.0000 mg | EXTENDED_RELEASE_TABLET | Freq: Every day | ORAL | 0 refills | Status: AC
  Filled 2022-12-16: qty 30, 30d supply, fill #0

## 2023-01-19 ENCOUNTER — Other Ambulatory Visit (HOSPITAL_COMMUNITY): Payer: Self-pay

## 2023-01-19 MED ORDER — SUMATRIPTAN SUCCINATE 50 MG PO TABS
50.0000 mg | ORAL_TABLET | Freq: Two times a day (BID) | ORAL | 2 refills | Status: AC
Start: 2023-01-19 — End: ?
  Filled 2023-01-19: qty 12, 20d supply, fill #0
  Filled 2023-03-19: qty 9, 5d supply, fill #0

## 2023-01-20 ENCOUNTER — Other Ambulatory Visit (HOSPITAL_COMMUNITY): Payer: Self-pay

## 2023-03-17 ENCOUNTER — Other Ambulatory Visit (HOSPITAL_COMMUNITY): Payer: Self-pay

## 2023-03-17 MED ORDER — METHYLPHENIDATE HCL ER (CD) 20 MG PO CPCR
20.0000 mg | ORAL_CAPSULE | Freq: Every day | ORAL | 0 refills | Status: DC
  Filled 2023-03-17: qty 30, 30d supply, fill #0

## 2023-03-19 ENCOUNTER — Other Ambulatory Visit (HOSPITAL_COMMUNITY): Payer: Self-pay

## 2023-04-21 ENCOUNTER — Other Ambulatory Visit (HOSPITAL_COMMUNITY): Payer: Self-pay

## 2023-04-21 MED ORDER — METHYLPHENIDATE HCL ER (CD) 20 MG PO CPCR
20.0000 mg | ORAL_CAPSULE | Freq: Every day | ORAL | 0 refills | Status: DC
Start: 2023-04-21 — End: 2023-06-04
  Filled 2023-04-21: qty 30, 30d supply, fill #0

## 2023-06-04 ENCOUNTER — Other Ambulatory Visit (HOSPITAL_COMMUNITY): Payer: Self-pay

## 2023-06-04 MED ORDER — METHYLPHENIDATE HCL ER (CD) 20 MG PO CPCR
20.0000 mg | ORAL_CAPSULE | Freq: Every day | ORAL | 0 refills | Status: DC
Start: 2023-06-04 — End: 2023-07-22
  Filled 2023-06-04: qty 30, 30d supply, fill #0

## 2023-06-07 ENCOUNTER — Other Ambulatory Visit (HOSPITAL_COMMUNITY): Payer: Self-pay

## 2023-07-22 ENCOUNTER — Other Ambulatory Visit (HOSPITAL_COMMUNITY): Payer: Self-pay

## 2023-07-22 MED ORDER — METHYLPHENIDATE HCL ER (CD) 20 MG PO CPCR
20.0000 mg | ORAL_CAPSULE | Freq: Every day | ORAL | 0 refills | Status: DC
Start: 2023-07-22 — End: 2023-09-02
  Filled 2023-07-22: qty 30, 30d supply, fill #0

## 2023-09-02 ENCOUNTER — Other Ambulatory Visit (HOSPITAL_COMMUNITY): Payer: Self-pay

## 2023-09-02 MED ORDER — METHYLPHENIDATE HCL ER (CD) 20 MG PO CPCR
20.0000 mg | ORAL_CAPSULE | Freq: Every day | ORAL | 0 refills | Status: DC
Start: 2023-09-02 — End: 2023-10-11
  Filled 2023-09-02: qty 24, 24d supply, fill #0
  Filled 2023-09-02: qty 6, 6d supply, fill #0

## 2023-10-11 ENCOUNTER — Other Ambulatory Visit (HOSPITAL_COMMUNITY): Payer: Self-pay

## 2023-10-11 MED ORDER — METHYLPHENIDATE HCL ER (CD) 20 MG PO CPCR
20.0000 mg | ORAL_CAPSULE | Freq: Every day | ORAL | 0 refills | Status: DC
Start: 2023-10-11 — End: 2023-11-22
  Filled 2023-10-11: qty 30, 30d supply, fill #0

## 2023-10-13 ENCOUNTER — Other Ambulatory Visit (HOSPITAL_COMMUNITY): Payer: Self-pay

## 2023-11-22 ENCOUNTER — Other Ambulatory Visit: Payer: Self-pay

## 2023-11-22 ENCOUNTER — Other Ambulatory Visit (HOSPITAL_COMMUNITY): Payer: Self-pay

## 2023-11-22 MED ORDER — METHYLPHENIDATE HCL ER (CD) 20 MG PO CPCR
20.0000 mg | ORAL_CAPSULE | Freq: Every day | ORAL | 0 refills | Status: AC
Start: 2023-11-21 — End: ?
  Filled 2023-11-22 – 2023-12-23 (×3): qty 30, 30d supply, fill #0

## 2023-11-23 ENCOUNTER — Other Ambulatory Visit (HOSPITAL_COMMUNITY): Payer: Self-pay

## 2023-11-25 ENCOUNTER — Other Ambulatory Visit (HOSPITAL_COMMUNITY): Payer: Self-pay

## 2023-11-29 ENCOUNTER — Other Ambulatory Visit (HOSPITAL_COMMUNITY): Payer: Self-pay

## 2023-11-29 MED ORDER — VTAMA 1 % EX CREA
1.0000 | TOPICAL_CREAM | Freq: Every day | CUTANEOUS | 1 refills | Status: AC
  Filled 2023-11-29: qty 60, 30d supply, fill #0

## 2023-12-08 ENCOUNTER — Other Ambulatory Visit (HOSPITAL_COMMUNITY): Payer: Self-pay

## 2023-12-08 MED ORDER — TACROLIMUS 0.1 % EX OINT
TOPICAL_OINTMENT | CUTANEOUS | 1 refills | Status: AC
  Filled 2023-12-08 – 2023-12-23 (×2): qty 30, 30d supply, fill #0

## 2023-12-16 ENCOUNTER — Other Ambulatory Visit (HOSPITAL_COMMUNITY): Payer: Self-pay

## 2023-12-20 ENCOUNTER — Other Ambulatory Visit (HOSPITAL_COMMUNITY): Payer: Self-pay

## 2023-12-23 ENCOUNTER — Other Ambulatory Visit (HOSPITAL_COMMUNITY): Payer: Self-pay

## 2023-12-24 ENCOUNTER — Other Ambulatory Visit (HOSPITAL_COMMUNITY): Payer: Self-pay

## 2023-12-24 MED ORDER — METHYLPHENIDATE HCL ER (CD) 20 MG PO CPCR
20.0000 mg | ORAL_CAPSULE | Freq: Every day | ORAL | 0 refills | Status: AC
Start: 2023-12-24 — End: ?
  Filled 2023-12-24: qty 30, 30d supply, fill #0

## 2024-02-21 ENCOUNTER — Other Ambulatory Visit (HOSPITAL_COMMUNITY): Payer: Self-pay

## 2024-02-21 MED ORDER — METHYLPHENIDATE HCL ER (CD) 20 MG PO CPCR
20.0000 mg | ORAL_CAPSULE | Freq: Every day | ORAL | 0 refills | Status: AC
Start: 2024-02-21 — End: ?
  Filled 2024-02-21: qty 30, 30d supply, fill #0

## 2024-02-22 ENCOUNTER — Other Ambulatory Visit (HOSPITAL_COMMUNITY): Payer: Self-pay

## 2024-02-23 ENCOUNTER — Other Ambulatory Visit (HOSPITAL_COMMUNITY): Payer: Self-pay

## 2024-03-24 ENCOUNTER — Other Ambulatory Visit (HOSPITAL_COMMUNITY): Payer: Self-pay

## 2024-03-24 MED ORDER — METHYLPHENIDATE HCL ER (CD) 20 MG PO CPCR
20.0000 mg | ORAL_CAPSULE | Freq: Every day | ORAL | 0 refills | Status: DC
  Filled 2024-03-24 – 2024-04-10 (×2): qty 30, 30d supply, fill #0

## 2024-04-04 ENCOUNTER — Other Ambulatory Visit (HOSPITAL_COMMUNITY): Payer: Self-pay

## 2024-04-10 ENCOUNTER — Other Ambulatory Visit (HOSPITAL_COMMUNITY): Payer: Self-pay

## 2024-05-30 ENCOUNTER — Other Ambulatory Visit (HOSPITAL_COMMUNITY): Payer: Self-pay

## 2024-05-30 MED ORDER — METHYLPHENIDATE HCL ER (CD) 20 MG PO CPCR
20.0000 mg | ORAL_CAPSULE | Freq: Every day | ORAL | 0 refills | Status: DC
  Filled 2024-05-30: qty 30, 30d supply, fill #0

## 2024-06-06 ENCOUNTER — Other Ambulatory Visit (HOSPITAL_BASED_OUTPATIENT_CLINIC_OR_DEPARTMENT_OTHER): Payer: Self-pay

## 2024-06-06 MED ORDER — LEVOCETIRIZINE DIHYDROCHLORIDE 5 MG PO TABS
5.0000 mg | ORAL_TABLET | Freq: Every day | ORAL | 5 refills | Status: AC
  Filled 2024-06-06: qty 30, 30d supply, fill #0

## 2024-06-17 ENCOUNTER — Other Ambulatory Visit (HOSPITAL_BASED_OUTPATIENT_CLINIC_OR_DEPARTMENT_OTHER): Payer: Self-pay

## 2024-07-04 ENCOUNTER — Other Ambulatory Visit (HOSPITAL_COMMUNITY): Payer: Self-pay

## 2024-07-04 MED ORDER — METHYLPHENIDATE HCL ER (CD) 20 MG PO CPCR
20.0000 mg | ORAL_CAPSULE | Freq: Every day | ORAL | 0 refills | Status: DC
  Filled 2024-07-04: qty 30, 30d supply, fill #0

## 2024-07-11 ENCOUNTER — Other Ambulatory Visit (HOSPITAL_COMMUNITY): Payer: Self-pay

## 2024-08-11 ENCOUNTER — Other Ambulatory Visit (HOSPITAL_COMMUNITY): Payer: Self-pay

## 2024-08-11 ENCOUNTER — Other Ambulatory Visit (HOSPITAL_BASED_OUTPATIENT_CLINIC_OR_DEPARTMENT_OTHER): Payer: Self-pay

## 2024-08-15 ENCOUNTER — Other Ambulatory Visit (HOSPITAL_COMMUNITY): Payer: Self-pay

## 2024-09-06 ENCOUNTER — Other Ambulatory Visit (HOSPITAL_COMMUNITY): Payer: Self-pay

## 2024-09-06 MED ORDER — METHYLPHENIDATE HCL ER (CD) 20 MG PO CPCR
20.0000 mg | ORAL_CAPSULE | Freq: Every day | ORAL | 0 refills | Status: AC
  Filled 2024-09-06: qty 30, 30d supply, fill #0
